# Patient Record
Sex: Male | Born: 1985 | Race: White | Hispanic: No | Marital: Single | Smoking: Former smoker
Health system: Southern US, Community
[De-identification: ages and names within clinical notes are randomized; demographics above are authoritative.]

## PROBLEM LIST (undated history)

## (undated) DIAGNOSIS — F419 Anxiety disorder, unspecified: Secondary | ICD-10-CM

## (undated) DIAGNOSIS — F909 Attention-deficit hyperactivity disorder, unspecified type: Secondary | ICD-10-CM

## (undated) DIAGNOSIS — G4730 Sleep apnea, unspecified: Secondary | ICD-10-CM

## (undated) DIAGNOSIS — F410 Panic disorder [episodic paroxysmal anxiety] without agoraphobia: Secondary | ICD-10-CM

## (undated) DIAGNOSIS — I10 Essential (primary) hypertension: Secondary | ICD-10-CM

## (undated) DIAGNOSIS — F329 Major depressive disorder, single episode, unspecified: Secondary | ICD-10-CM

## (undated) HISTORY — DX: Sleep apnea, unspecified: G47.30

## (undated) HISTORY — DX: Attention-deficit hyperactivity disorder, unspecified type: F90.9

---

## 2007-02-21 ENCOUNTER — Emergency Department: Payer: Self-pay | Admitting: Emergency Medicine

## 2007-08-25 ENCOUNTER — Emergency Department: Payer: Self-pay | Admitting: Unknown Physician Specialty

## 2007-11-17 ENCOUNTER — Emergency Department: Payer: Self-pay | Admitting: Emergency Medicine

## 2007-11-27 ENCOUNTER — Emergency Department: Payer: Self-pay | Admitting: Internal Medicine

## 2010-03-29 ENCOUNTER — Emergency Department: Payer: Self-pay | Admitting: Emergency Medicine

## 2010-10-02 ENCOUNTER — Emergency Department: Payer: Self-pay | Admitting: Emergency Medicine

## 2010-11-16 ENCOUNTER — Emergency Department: Payer: Self-pay | Admitting: Emergency Medicine

## 2011-01-16 ENCOUNTER — Emergency Department: Payer: Self-pay | Admitting: Emergency Medicine

## 2011-01-16 ENCOUNTER — Emergency Department: Payer: Self-pay | Admitting: Internal Medicine

## 2011-08-24 ENCOUNTER — Emergency Department: Payer: Self-pay | Admitting: Emergency Medicine

## 2011-08-24 LAB — COMPREHENSIVE METABOLIC PANEL
Albumin: 4.2 g/dL (ref 3.4–5.0)
Alkaline Phosphatase: 109 U/L (ref 50–136)
Anion Gap: 8 (ref 7–16)
Calcium, Total: 9.1 mg/dL (ref 8.5–10.1)
Chloride: 102 mmol/L (ref 98–107)
Co2: 29 mmol/L (ref 21–32)
Creatinine: 1.18 mg/dL (ref 0.60–1.30)
EGFR (African American): >60
EGFR (Non-African Amer.): >60
Glucose: 96 mg/dL (ref 65–99)
Potassium: 3.6 mmol/L (ref 3.5–5.1)
SGPT (ALT): 27 U/L
Sodium: 139 mmol/L (ref 136–145)

## 2011-08-24 LAB — URINALYSIS, COMPLETE
Blood: NEGATIVE
Leukocyte Esterase: NEGATIVE
Ph: 8 (ref 4.5–8.0)
Protein: NEGATIVE
RBC,UR: 3 /HPF (ref 0–5)
Specific Gravity: 1.015 (ref 1.003–1.030)
Squamous Epithelial: <1
WBC UR: 1 /HPF (ref 0–5)

## 2011-08-24 LAB — CBC
HGB: 19.5 g/dL — ABNORMAL HIGH (ref 13.0–18.0)
MCH: 30.6 pg (ref 26.0–34.0)
RBC: 6.36 10*6/uL — ABNORMAL HIGH (ref 4.40–5.90)

## 2011-08-24 LAB — DRUG SCREEN, URINE
Amphetamines, Ur Screen: NEGATIVE (ref ?–1000)
Barbiturates, Ur Screen: NEGATIVE (ref ?–200)
Benzodiazepine, Ur Scrn: NEGATIVE (ref ?–200)
MDMA (Ecstasy)Ur Screen: NEGATIVE (ref ?–500)
Phencyclidine (PCP) Ur S: NEGATIVE (ref ?–25)
Tricyclic, Ur Screen: NEGATIVE (ref ?–1000)

## 2011-08-24 LAB — TSH: Thyroid Stimulating Horm: 0.83 u[IU]/mL

## 2011-08-24 LAB — ETHANOL: Ethanol: <3 mg/dL

## 2011-11-26 ENCOUNTER — Emergency Department: Payer: Self-pay | Admitting: *Deleted

## 2012-02-25 LAB — CBC WITH DIFFERENTIAL/PLATELET
Basophil #: 0.1 10*3/uL (ref 0.0–0.1)
Eosinophil #: 0.1 10*3/uL (ref 0.0–0.7)
Eosinophil %: 1 %
HCT: 55.6 % — ABNORMAL HIGH (ref 40.0–52.0)
Lymphocyte #: 5.6 10*3/uL — ABNORMAL HIGH (ref 1.0–3.6)
Lymphocyte %: 38.4 %
MCH: 32.2 pg (ref 26.0–34.0)
MCHC: 34.9 g/dL (ref 32.0–36.0)
Monocyte %: 7.2 %
Neutrophil %: 52.8 %
RDW: 13 % (ref 11.5–14.5)
WBC: 14.7 10*3/uL — ABNORMAL HIGH (ref 3.8–10.6)

## 2012-02-25 LAB — COMPREHENSIVE METABOLIC PANEL
Albumin: 4.8 g/dL (ref 3.4–5.0)
Anion Gap: 10 (ref 7–16)
BUN: 5 mg/dL — ABNORMAL LOW (ref 7–18)
Calcium, Total: 9 mg/dL (ref 8.5–10.1)
Chloride: 103 mmol/L (ref 98–107)
Co2: 28 mmol/L (ref 21–32)
EGFR (African American): >60
EGFR (Non-African Amer.): >60
Osmolality: 279 (ref 275–301)
Potassium: 3.4 mmol/L — ABNORMAL LOW (ref 3.5–5.1)
SGOT(AST): 33 U/L (ref 15–37)
SGPT (ALT): 36 U/L (ref 12–78)
Total Protein: 8.7 g/dL — ABNORMAL HIGH (ref 6.4–8.2)

## 2012-02-25 LAB — PROTIME-INR
INR: 0.9
INR: 1
Prothrombin Time: 12.6 secs (ref 11.5–14.7)
Prothrombin Time: 13.8 secs (ref 11.5–14.7)

## 2012-02-26 LAB — CBC WITH DIFFERENTIAL/PLATELET
Basophil #: 0 x10 3/mm 3
Basophil %: 0.2 %
Eosinophil #: 0.1 x10 3/mm 3
Eosinophil %: 0.5 %
HCT: 45.4 %
HGB: 16.1 g/dL
Lymphocyte %: 13.3 %
Lymphs Abs: 2.1 x10 3/mm 3
MCH: 32.6 pg
MCHC: 35.5 g/dL
MCV: 92 fL
Monocyte #: 1.6 "x10 3/mm " — ABNORMAL HIGH
Monocyte %: 10.2 %
Neutrophil #: 11.9 x10 3/mm 3 — ABNORMAL HIGH
Neutrophil %: 75.8 %
Platelet: 212 x10 3/mm 3
RBC: 4.95 x10 6/mm 3
RDW: 13.1 %
WBC: 15.7 x10 3/mm 3 — ABNORMAL HIGH

## 2012-02-26 LAB — POTASSIUM: Potassium: 3.5 mmol/L

## 2012-02-27 ENCOUNTER — Inpatient Hospital Stay: Payer: Self-pay | Admitting: Surgery

## 2012-03-01 LAB — CBC WITH DIFFERENTIAL/PLATELET
Basophil #: 0 10*3/uL (ref 0.0–0.1)
Basophil %: 0.3 %
Eosinophil #: 0.1 10*3/uL (ref 0.0–0.7)
Eosinophil %: 1 %
HCT: 46.2 % (ref 40.0–52.0)
Lymphocyte #: 1.4 10*3/uL (ref 1.0–3.6)
MCH: 32.2 pg (ref 26.0–34.0)
MCV: 92 fL (ref 80–100)
Monocyte %: 9.1 %
Neutrophil #: 9.1 10*3/uL — ABNORMAL HIGH (ref 1.4–6.5)
Platelet: 240 10*3/uL (ref 150–440)
RBC: 5.01 10*6/uL (ref 4.40–5.90)
RDW: 13.2 % (ref 11.5–14.5)

## 2013-03-11 ENCOUNTER — Emergency Department: Payer: Self-pay | Admitting: Emergency Medicine

## 2014-08-27 NOTE — Discharge Summary (Signed)
PATIENT NAME:  Gilbert Alvarez, Gilbert Alvarez MR#:  [id number] DATE OF BIRTH:  Nov 16, 1985  DATE OF ADMISSION:  02/27/2012 DATE OF DISCHARGE:  03/01/2012  TRANSFER DIAGNOSES: 1. Right index finger copperhead venomous snakebite with skin necrosis and need for tertiary care hand surgery evaluation.  2. History of chronic alcoholism.   PROCEDURES: Surgical consultation, elevation of hand and arm, intravenous antibiotics.   HOSPITAL COURSE SUMMARY: The patient was admitted from the Emergency Room to the medical service following the administration of antivenom. Surgical services became involved on the 19th. The patient was transferred to the surgical service. Over the course of the next several days the swelling in his wrist and arm improved. Due to noncompliance concerns the patient was kept in the hospital several more days. Over the course of that, his right index finger developed significant bulla. These appeared to be hemorrhagic in nature. Skin went from being viable to being areas of nonviability. There was significant swelling of the digit. Pro time initially was normal. Initially his white count was elevated and then repeated found to be within normal limits. Furthermore, his hemoglobin and platelet count remained normal. After discussion with the patient in consultation with [facility] it was decided to transfer the patient into their care. Arrangements have been made.    TRANSFER MEDICATIONS:  1. Acetaminophen. 2. Tylox 5/325, 1 to 2 tabs p.o. q.4 hours as needed for pain. 3. Ativan 0.5 to 1 mg IV push q.4 hours p.r.n. agitation as per CIWA protocol.  4. Nicotine patch. 5. Zofran as needed. 6. Paxil 40 mg once a day. 7. Ancef 1 gram IV every six hours.      REASON FOR TRANSFER: Unavailability of dedicated hand surgery specialist in our community. Possible need for debridement and grafting and reconstructive surgery.   ____________________________ Redge GainerMark A. Egbert GaribaldiBird,  MD mab:cms D: 03/01/2012 15:44:31 ET T: 03/01/2012 15:56:24 ET JOB#: [id number]  cc: Loraine LericheMark A. Egbert GaribaldiBird, MD, <Dictator>  Raynald KempMARK A Michaeline Eckersley MD ELECTRONICALLY SIGNED 03/01/2012 16:58

## 2014-08-27 NOTE — H&P (Signed)
PATIENT NAME:  LAYLA, GRAMM MR#:  [id number] DATE OF BIRTH:  [birth year]  DATE OF ADMISSION:  02/25/2012  PRIMARY CARE PHYSICIAN: He has no physician.   CHIEF COMPLAINT: Snakebite.   HISTORY OF PRESENT ILLNESS: Mr. Glazier is a 29 year old Caucasian male who states that he has two copperhead snakes, raising them at home. The purpose is to milk their venom and he thinks that he can help breast cancer research. The patient is a chronic alcoholic.  He is intoxicated with alcohol and states that he was trying to teach one of his friends how to handle a snake.  This attempt resulted in being bit by one snake on the index finger of his right hand. The patient came to the Emergency Department with soft tissue swelling and tingling and numbness sensation of the hand. He was also tachycardic with heart rate reaching 130 beats per minute, but this subsequently went down to 100 per minute. He denies any shortness of breath. No chest pain. No dizziness. No abdominal pain. No vomiting, although later he had nausea and he had one episode of vomiting.     REVIEW OF SYSTEMS: CONSTITUTIONAL: Denies any fever. No chills. No fatigue. EYES: No blurring of vision. No double vision. ENT: No hearing impairment. No sore throat. No dysphagia. CARDIOVASCULAR: No chest pain. No shortness of breath. No syncope. RESPIRATORY: No shortness of breath. No cough. No sputum production. GASTROINTESTINAL: No abdominal pain. No nausea. No vomiting, except just now he had one episode of vomiting but earlier did not. GENITOURINARY: No dysuria. No frequency of urination. MUSCULOSKELETAL: No joint pain or swelling. No muscular pain or swelling other than the right hand site of the bite. INTEGUMENT: No skin rash. No ulcers, but he has soft tissue swelling of the right hand. NEUROLOGIC: No focal weakness. No seizure activity. No headache. PSYCH: He has anxiety but no depression. ENDOCRINE: No polyuria or polydipsia. No heat or cold intolerance.   PAST  MEDICAL HISTORY: Healthy apart from history of anxiety disorder.   PAST SURGICAL HISTORY: None.   SOCIAL HABITS: Chronic smoker, 1 pack per day since age of 89. He drinks beer daily, about five cans of beer.   FAMILY HISTORY: His father died from pancreatic cancer. His mother is alive and she is healthy. He does not know much of her health issues as he does not have much contact with her.   SOCIAL HISTORY: He has an eighth grade education.  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  Right now he is living with his grandmother.   ADMISSION MEDICATIONS:  Paroxetine or Paxil 40 mg once a day.   ALLERGIES: No known drug allergies. He reports side effects from Zoloft that make him feel tired or lethargic- this is not a true allergy.   PHYSICAL EXAMINATION:  VITAL SIGNS: Blood pressure 120/84, respiratory rate 20, pulse earlier was 132 and now it is 100. Temperature 98.8, oxygen saturation 95%.   GENERAL APPEARANCE: Young male lying in bed in no acute distress.   HEAD: Unremarkable. No pallor. No icterus. No cyanosis.   EARS, NOSE, THROAT: Hearing was normal. Nasal mucosa, lips, tongue were normal.   EYES: Normal eyelids and conjunctivae. Pupils about 5 mm, equal, and reactive to light.   NECK: Supple. Trachea at midline. No thyromegaly. No cervical lymphadenopathy. No masses.   HEART: Normal S1, S2. No S3, S4. No murmur. No gallop. No carotid bruits.   LUNGS: Normal breathing pattern without use  of accessory muscles. No rales. No wheezing.   ABDOMEN: Soft without tenderness. No hepatosplenomegaly. No masses. No hernias.   SKIN: Right hand soft tissue swelling. There is a puncture wound on the lateral side of the index finger. The rest of the skin examination is unremarkable. No rash. No subcutaneous nodules.   MUSCULOSKELETAL: No joint swelling. No clubbing.   NEUROLOGIC: Cranial nerves II through XII are intact. No focal motor deficit.   PSYCHIATRIC: The patient is  alert, oriented to place and people. Mood and affect were normal.   LABORATORY, DIAGNOSTIC, AND RADIOLOGICAL DATA: Serum glucose 99, BUN 5, creatinine 0.7, sodium 141, potassium 3.4. Alcohol level was 204. Liver function tests were normal. Bilirubin was 1.4. CBC showed white count of 14,000, hemoglobin 19, hematocrit 55, platelet count 264. Prothrombin time 12.6 with INR of 0.9.   ASSESSMENT:  1. Copperhead snakebite on the right hand with resulting soft tissue swelling.  2. Chronic alcoholism.  3. Mild hypokalemia.  4. Leukocytosis.  5. Anxiety disorder.   PLAN: The patient will be admitted to the hospital for observation. We will monitor the right hand soft tissue swelling. The patient right now is receiving antivenom, IV hydration, pain control, Zofran p.r.n. for nausea and vomiting. Potassium replacement for the mild hypokalemia. The patient will receive one dose of 20 mEq. I will repeat his prothrombin time and APTT in the morning and determine at that point if he needs further treatment with antivenom or not. We will place him on a nicotine patch since he is a smoker. The patient needs a reassuring attitude since he is anxious and asking if he is going to die. During the conversation he became tachycardic just simply because we are discussing his illness. Ativan p.r.n. whenever needed.   TIME SPENT: Time spent evaluating this patient took more than 55 minutes.    ____________________________ Carney CornersAmir M. Rudene Rearwish, MD amd:bjt D: 02/25/2012 05:45:53 ET T: 02/25/2012 09:57:45 ET JOB#: [id number]  cc: Carney CornersAmir M. Rudene Rearwish, MD, <Dictator> Zollie ScaleAMIR M Lorenza Winkleman MD ELECTRONICALLY SIGNED 02/25/2012 22:35

## 2014-08-27 NOTE — Consult Note (Signed)
Brief Consult Note: Diagnosis: R index finger copperhead bite.   Patient was seen by consultant.   Recommend further assessment or treatment.   Orders entered.   Discussed with Attending MD.   Comments: Full grown, regularly milked snake - therefore he received a lot of venom.  Normal sensation, severe swelling of hand and wrist and forearm with small black blister and fang marks on index finger. He may lose a little skin, but I doubt he loses a phalanx or the digit. He needs significant elevation into the 'Heil Hitler' position as a baseball throwing position may compress his SCV. I suggest continued hospitalization as he will likely be non-compliant with this at home. Ultimately he may need OT. I will take him on my service if you wish.  Electronic Signatures: Claude MangesMarterre, Santosha Jividen F (MD)  (Signed 19-Oct-13 12:12)  Authored: Brief Consult Note   Last Updated: 19-Oct-13 12:12 by Claude MangesMarterre, Keyton Bhat F (MD)

## 2014-09-10 ENCOUNTER — Encounter: Payer: Self-pay | Admitting: *Deleted

## 2014-09-10 ENCOUNTER — Emergency Department
Admission: EM | Admit: 2014-09-10 | Discharge: 2014-09-10 | Disposition: A | Discharge status: Home / Self Care | Payer: Self-pay | Attending: Emergency Medicine | Admitting: Emergency Medicine

## 2014-09-10 DIAGNOSIS — S61255A Open bite of left ring finger without damage to nail, initial encounter: Secondary | ICD-10-CM | POA: Insufficient documentation

## 2014-09-10 DIAGNOSIS — Y9389 Activity, other specified: Secondary | ICD-10-CM | POA: Insufficient documentation

## 2014-09-10 DIAGNOSIS — Y998 Other external cause status: Secondary | ICD-10-CM | POA: Insufficient documentation

## 2014-09-10 DIAGNOSIS — W5301XA Bitten by mouse, initial encounter: Secondary | ICD-10-CM | POA: Insufficient documentation

## 2014-09-10 DIAGNOSIS — Y9289 Other specified places as the place of occurrence of the external cause: Secondary | ICD-10-CM | POA: Insufficient documentation

## 2014-09-10 DIAGNOSIS — Z87891 Personal history of nicotine dependence: Secondary | ICD-10-CM | POA: Insufficient documentation

## 2014-09-10 HISTORY — DX: Major depressive disorder, single episode, unspecified: F32.9

## 2014-09-10 HISTORY — DX: Anxiety disorder, unspecified: F41.9

## 2014-09-10 MED ORDER — AMOXICILLIN-POT CLAVULANATE 875-125 MG PO TABS: 1.0000 | ORAL_TABLET | Freq: Two times a day (BID) | ORAL | Status: AC

## 2014-09-10 NOTE — Discharge Instructions (Signed)

## 2014-09-10 NOTE — ED Notes (Signed)
Pt states he was helping his grandfather clean out a worm bed when he was bit on hos left second finger by a mouse. Pt wants to make sure everything is ok.

## 2014-09-10 NOTE — ED Provider Notes (Signed)
[facility] Emergency Department Provider Note    ____________________________________________  Time seen: 2240  I have reviewed the triage vital signs and the nursing notes.   HISTORY  Chief Complaint Animal Bite       HPI Gilbert Alvarez is a 29 y.o. male who was bitten by a mouse on his left ring finger since tetanus is up-to-date concerned about possible rabies wants the evaluation of the wound denies any pain there is no redness for range of motion and no other complaints at this time     Past Medical History  Diagnosis Date  . Depression   . Anxiety     There are no active problems to display for this patient.   No past surgical history on file.  Current Outpatient Rx  Name  Route  Sig  Dispense  Refill  . amoxicillin-clavulanate (AUGMENTIN) 875-125 MG per tablet   Oral   Take 1 tablet by mouth 2 (two) times daily.   14 tablet   0     Allergies Tramadol  No family history on file.  Social History History  Substance Use Topics  . Smoking status: Former Games developer  . Smokeless tobacco: Current User  . Alcohol Use: No    Review of Systems  Constitutional: Negative for fever. Eyes: Negative for visual changes. ENT: Negative for sore throat. Cardiovascular: Negative for chest pain. Musculoskeletal: Negative for back pain. Skin: Negative for rash. Neurological: Negative for headaches, focal weakness or numbness.   10-point ROS otherwise negative.  ____________________________________________   PHYSICAL EXAM:  VITAL SIGNS: ED Triage Vitals  Enc Vitals Group     BP 09/10/14 2229 129/87 mmHg     Pulse Rate 09/10/14 2229 103     Resp 09/10/14 2229 17     Temp 09/10/14 2229 98.7 F (37.1 C)     Temp src --      SpO2 09/10/14 2229 98 %     Weight 09/10/14 2229 160 lb (72.576 kg)     Height 09/10/14 2229  (1.676 m)     Head Cir --      Peak Flow --      Pain Score --      Pain Loc --      Pain Edu? --    Excl. in GC? --      Constitutional: Alert and oriented. Well appearing and in no distress.   Cardiovascular: Normal rate, regular rhythm. Normal and symmetric distal pulses are present in all extremities. No murmurs, rubs, or gallops. Respiratory: Normal respiratory effort without tachypnea nor retractions. Breath sounds are clear and equal bilaterally. No wheezes/rales/rhonchi.  Musculoskeletal: Nontender with normal range of motion in all extremities. No joint effusions.  No lower extremity tenderness nor edema. Neurologic:  Normal speech and language. No gross focal neurologic deficits are appreciated. Speech is normal. No gait instability. Skin:  Skin is warm, dry and intact. No rash noted. Psychiatric: Mood and affect are normal. Speech and behavior are normal. Patient exhibits appropriate insight and judgment.  ____________________________________________      PROCEDURES  Procedure(s) performed: None  Critical Care performed: No  ____________________________________________   INITIAL IMPRESSION / ASSESSMENT AND PLAN / ED COURSE  Pertinent labs & imaging results that were available during my care of the patient were reviewed by me and considered in my medical decision making (see chart for details).  Now spite uncomplicated doesn't appear to have broken the skin and right the patient prescription for oral  antibiotics just in case advised against rabies series since it was a mouse bite will have patient follow-up in the next 2-3 days  ____________________________________________   FINAL CLINICAL IMPRESSION(S) / ED DIAGNOSES  Final diagnoses:  Bitten by mouse, initial encounter    Misao Fackrell Rosalyn GessWilliam C Caleigha Zale, PA-C 09/10/14 2255  Loleta Roseory Forbach, MD 09/10/14 2330

## 2015-09-07 ENCOUNTER — Emergency Department: Payer: Self-pay

## 2015-09-07 ENCOUNTER — Emergency Department
Admission: EM | Admit: 2015-09-07 | Discharge: 2015-09-07 | Disposition: A | Discharge status: Home / Self Care | Payer: Self-pay | Attending: Student | Admitting: Student

## 2015-09-07 ENCOUNTER — Encounter: Payer: Self-pay | Admitting: Emergency Medicine

## 2015-09-07 DIAGNOSIS — F329 Major depressive disorder, single episode, unspecified: Secondary | ICD-10-CM | POA: Insufficient documentation

## 2015-09-07 DIAGNOSIS — R079 Chest pain, unspecified: Secondary | ICD-10-CM | POA: Insufficient documentation

## 2015-09-07 DIAGNOSIS — F1010 Alcohol abuse, uncomplicated: Secondary | ICD-10-CM | POA: Insufficient documentation

## 2015-09-07 DIAGNOSIS — F17210 Nicotine dependence, cigarettes, uncomplicated: Secondary | ICD-10-CM | POA: Insufficient documentation

## 2015-09-07 DIAGNOSIS — F410 Panic disorder [episodic paroxysmal anxiety] without agoraphobia: Secondary | ICD-10-CM

## 2015-09-07 LAB — BLOOD GAS, VENOUS
Acid-base deficit: 0.9 mmol/L (ref 0.0–2.0)
Bicarbonate: 24.3 mEq/L (ref 21.0–28.0)
O2 SAT: 64 %
PATIENT TEMPERATURE: 37
PCO2 VEN: 41 mmHg — AB (ref 44.0–60.0)
pH, Ven: 7.38 (ref 7.320–7.430)
pO2, Ven: 34 mmHg (ref 31.0–45.0)

## 2015-09-07 LAB — CBC WITH DIFFERENTIAL/PLATELET
Basophils Absolute: 0 10*3/uL (ref 0–0.1)
Basophils Relative: 0 %
Eosinophils Absolute: 0.1 10*3/uL (ref 0–0.7)
Eosinophils Relative: 1 %
HCT: 57.7 % — ABNORMAL HIGH (ref 40.0–52.0)
Hemoglobin: 19.9 g/dL — ABNORMAL HIGH (ref 13.0–18.0)
Lymphocytes Relative: 18 %
Lymphs Abs: 1.8 10*3/uL (ref 1.0–3.6)
MCH: 32.9 pg (ref 26.0–34.0)
MCHC: 34.5 g/dL (ref 32.0–36.0)
MCV: 95.5 fL (ref 80.0–100.0)
Monocytes Absolute: 1.1 10*3/uL — ABNORMAL HIGH (ref 0.2–1.0)
Monocytes Relative: 11 %
Neutro Abs: 7.2 10*3/uL — ABNORMAL HIGH (ref 1.4–6.5)
Neutrophils Relative %: 70 %
Platelets: 234 10*3/uL (ref 150–440)
RBC: 6.04 MIL/uL — ABNORMAL HIGH (ref 4.40–5.90)
RDW: 14.5 % (ref 11.5–14.5)
WBC: 10.2 10*3/uL (ref 3.8–10.6)

## 2015-09-07 LAB — COMPREHENSIVE METABOLIC PANEL
ALK PHOS: 75 U/L (ref 38–126)
ALT: 34 U/L (ref 17–63)
ALT: 23 U/L (ref 17–63)
ANION GAP: 4 — AB (ref 5–15)
AST: 40 U/L (ref 15–41)
AST: 22 U/L (ref 15–41)
Albumin: 4.5 g/dL (ref 3.5–5.0)
Albumin: 3 g/dL — ABNORMAL LOW (ref 3.5–5.0)
Alkaline Phosphatase: 111 U/L (ref 38–126)
Anion gap: 22 — ABNORMAL HIGH (ref 5–15)
BILIRUBIN TOTAL: 2.9 mg/dL — AB (ref 0.3–1.2)
BUN: 6 mg/dL (ref 6–20)
BUN: <5 mg/dL — ABNORMAL LOW (ref 6–20)
CALCIUM: 7.4 mg/dL — AB (ref 8.9–10.3)
CO2: 18 mmol/L — ABNORMAL LOW (ref 22–32)
CO2: 25 mmol/L (ref 22–32)
Calcium: 9.5 mg/dL (ref 8.9–10.3)
Chloride: 98 mmol/L — ABNORMAL LOW (ref 101–111)
Chloride: 110 mmol/L (ref 101–111)
Creatinine, Ser: 0.9 mg/dL (ref 0.61–1.24)
Creatinine, Ser: 0.81 mg/dL (ref 0.61–1.24)
GFR calc Af Amer: >60 mL/min (ref >60)
GFR calc non Af Amer: >60 mL/min (ref >60)
GFR calc non Af Amer: >60 mL/min (ref >60)
Glucose, Bld: 107 mg/dL — ABNORMAL HIGH (ref 65–99)
Glucose, Bld: 397 mg/dL — ABNORMAL HIGH (ref 65–99)
Potassium: 3.3 mmol/L — ABNORMAL LOW (ref 3.5–5.1)
Potassium: 3.5 mmol/L (ref 3.5–5.1)
Sodium: 138 mmol/L (ref 135–145)
Sodium: 139 mmol/L (ref 135–145)
TOTAL PROTEIN: 5.4 g/dL — AB (ref 6.5–8.1)
Total Bilirubin: 3.9 mg/dL — ABNORMAL HIGH (ref 0.3–1.2)
Total Protein: 7.9 g/dL (ref 6.5–8.1)

## 2015-09-07 LAB — SALICYLATE LEVEL: Salicylate Lvl: <4 mg/dL (ref 2.8–30.0)

## 2015-09-07 LAB — MAGNESIUM: MAGNESIUM: 2 mg/dL (ref 1.7–2.4)

## 2015-09-07 LAB — URINE DRUG SCREEN, QUALITATIVE (ARMC ONLY)
Amphetamines, Ur Screen: NOT DETECTED
BARBITURATES, UR SCREEN: NOT DETECTED
BENZODIAZEPINE, UR SCRN: NOT DETECTED
CANNABINOID 50 NG, UR ~~LOC~~: NOT DETECTED
COCAINE METABOLITE, UR ~~LOC~~: NOT DETECTED
MDMA (Ecstasy)Ur Screen: NOT DETECTED
Methadone Scn, Ur: NOT DETECTED
OPIATE, UR SCREEN: NOT DETECTED
Phencyclidine (PCP) Ur S: NOT DETECTED
TRICYCLIC, UR SCREEN: NOT DETECTED

## 2015-09-07 LAB — BILIRUBIN, DIRECT: Bilirubin, Direct: 0.2 mg/dL (ref 0.1–0.5)

## 2015-09-07 LAB — ETHANOL: Alcohol, Ethyl (B): <5 mg/dL (ref <5)

## 2015-09-07 LAB — ACETAMINOPHEN LEVEL

## 2015-09-07 LAB — OSMOLALITY: Osmolality: 290 mOsm/kg (ref 275–295)

## 2015-09-07 LAB — TROPONIN I: Troponin I: <0.03 ng/mL (ref <0.031)

## 2015-09-07 MED ORDER — SODIUM CHLORIDE 0.9 % IV BOLUS (SEPSIS)
1000.0000 mL | Freq: Once | INTRAVENOUS | Status: AC
Start: 2015-09-07 — End: 2015-09-07
  Administered 2015-09-07: 1000 mL via INTRAVENOUS

## 2015-09-07 MED ORDER — LORAZEPAM 2 MG/ML IJ SOLN
1.0000 mg | Freq: Once | INTRAMUSCULAR | Status: AC
  Administered 2015-09-07: 1 mg via INTRAVENOUS
  Filled 2015-09-07: qty 1

## 2015-09-07 MED ORDER — SODIUM CHLORIDE 0.9 % IV BOLUS (SEPSIS)
1000.0000 mL | Freq: Once | INTRAVENOUS | Status: AC
  Administered 2015-09-07: 1000 mL via INTRAVENOUS

## 2015-09-07 MED ORDER — DEXTROSE 5 % AND 0.9 % NACL IV BOLUS
1000.0000 mL | Freq: Once | INTRAVENOUS | Status: AC
  Administered 2015-09-07: 1000 mL via INTRAVENOUS
  Filled 2015-09-07: qty 1000

## 2015-09-07 NOTE — ED Notes (Signed)
Patient A+O x4. Patient has no complaints at this time. Patient states that his anxiety has resolved and wishes to hold ativan at this time.

## 2015-09-07 NOTE — ED Provider Notes (Signed)
[facility] Emergency Department Provider Note   ____________________________________________  Time seen: Approximately 11:52 AM  I have reviewed the triage vital signs and the nursing notes.   HISTORY  Chief Complaint Panic Attack    HPI Gilbert Alvarez is a 30 y.o. male with history of anxiety, depression, heavy daily alcohol abuse who presents for evaluation of a panic attack which began just prior to arrival, gradual onset, constant since onset, severe. Patient reports that while he was driving today he developed some shortness of breath, sensation that his whole body was getting tense and drawing up, he started having spasms in his hands, tightness in his chest. He reports this is consistent with his usual panic attacks. He has had 2-3 panic attacks over the past few days which he believes is triggered by upper respiratory tract infection symptoms. He reports that 3 days ago he developed some cough and runny nose. When he coughed, he developed some pain in his chest and he reports he got quite anxious because he was worried that "something was going on with my lungs". Denies fever, no vomiting, diarrhea, fevers or chills. He denies any history of coronary artery disease, denies any personal or family history of PE or DVT, hemoptysis, no recent surgeries, no estrogen use. He has been noncompliant with his Paxil. He denies any suicidal ideation, homicidal ideation or audiovisual hallucinations.   Past Medical History  Diagnosis Date  . Depression   . Anxiety     There are no active problems to display for this patient.   History reviewed. No pertinent past surgical history.  No current outpatient prescriptions on file.  Allergies Tramadol  History reviewed. No pertinent family history.  Social History Social History  Substance Use Topics  . Smoking status: Current Every Day Smoker -- 1.00 packs/day    Types: Cigarettes  . Smokeless tobacco: Current  User  . Alcohol Use: Yes     Comment: occassionaly    Review of Systems Constitutional: No fever/chills Eyes: No visual changes. ENT: No sore throat. Cardiovascular: +chest pain. Respiratory: Denies shortness of breath. Gastrointestinal: No abdominal pain.  No nausea, no vomiting.  No diarrhea.  No constipation. Genitourinary: Negative for dysuria. Musculoskeletal: Negative for back pain. Skin: Negative for rash. Neurological: Negative for headaches, focal weakness or numbness.  10-point ROS otherwise negative.  ____________________________________________   PHYSICAL EXAM:  VITAL SIGNS: ED Triage Vitals  Enc Vitals Group     BP 09/07/15 1146 139/96 mmHg     Pulse Rate 09/07/15 1146 89     Resp 09/07/15 1146 22     Temp 09/07/15 1146 97.8 F (36.6 C)     Temp Source 09/07/15 1146 Oral     SpO2 09/07/15 1146 100 %     Weight 09/07/15 1146 160 lb (72.576 kg)     Height 09/07/15 1146  (1.676 m)     Head Cir --      Peak Flow --      Pain Score --      Pain Loc --      Pain Edu? --      Excl. in GC? --     Constitutional: Alert and oriented. Severely anxious-appearing, hyperventilating, obvious carpopedal spasms. Eyes: Conjunctivae are normal. PERRL. EOMI. Head: Atraumatic. Nose: No congestion/rhinnorhea. Mouth/Throat: Mucous membranes are moist.  Oropharynx non-erythematous. Neck: No stridor.  Supple without meningismus. Cardiovascular: Normal rate, regular rhythm. Grossly normal heart sounds.  Good peripheral circulation. Respiratory: Normal respiratory  effort, mild tachypnea. No retractions. Lungs CTAB. Gastrointestinal: Soft and nontender. No distention.  No CVA tenderness. Genitourinary: deferred Musculoskeletal: No lower extremity tenderness nor edema.  No joint effusions. No calf swelling/asymmetry/tenderness. Neurologic:  Normal speech and language. No gross focal neurologic deficits are appreciated.  Skin:  Skin is warm, dry and intact. No rash  noted. Psychiatric: Mood is very anxious, affect is normal. Speech and behavior are normal.  ____________________________________________   LABS (all labs ordered are listed, but only abnormal results are displayed)  Labs Reviewed  CBC WITH DIFFERENTIAL/PLATELET - Abnormal; Notable for the following:    RBC 6.04 (*)    Hemoglobin 19.9 (*)    HCT 57.7 (*)    Neutro Abs 7.2 (*)    Monocytes Absolute 1.1 (*)    All other components within normal limits  COMPREHENSIVE METABOLIC PANEL - Abnormal; Notable for the following:    Potassium 3.3 (*)    Chloride 98 (*)    CO2 18 (*)    Glucose, Bld 107 (*)    Total Bilirubin 3.9 (*)    Anion gap 22 (*)    All other components within normal limits  BLOOD GAS, VENOUS - Abnormal; Notable for the following:    pCO2, Ven 41 (*)    All other components within normal limits  ACETAMINOPHEN LEVEL - Abnormal; Notable for the following:    Acetaminophen (Tylenol), Serum <10 (*)    All other components within normal limits  COMPREHENSIVE METABOLIC PANEL - Abnormal; Notable for the following:    Glucose, Bld 397 (*)    BUN <5 (*)    Calcium 7.4 (*)    Total Protein 5.4 (*)    Albumin 3.0 (*)    Total Bilirubin 2.9 (*)    Anion gap 4 (*)    All other components within normal limits  MAGNESIUM  TROPONIN I  BILIRUBIN, DIRECT  ETHANOL  URINE DRUG SCREEN, QUALITATIVE ([facility] ONLY)  SALICYLATE LEVEL  OSMOLALITY   ____________________________________________  EKG  ED ECG REPORT I, Gayla DossGayle, Emilygrace Grothe A, the attending physician, personally viewed and interpreted this ECG.   Date: 09/07/2015  EKG Time: 11:46  Rate: 87  Rhythm: normal sinus rhythm  Axis: right  Intervals:none  ST&T Change: No acute ST elevation.  ____________________________________________  RADIOLOGY  CXR IMPRESSION: No radiographic evidence of acute cardiopulmonary disease.   US abdomen IMPRESSION: 1. No acute abnormalities in the abdomen. Specifically, no evidence of  biliary tract obstruction. ____________________________________________   PROCEDURES  Procedure(s) performed: None  Critical Care performed: No  ____________________________________________   INITIAL IMPRESSION / ASSESSMENT AND PLAN / ED COURSE  Pertinent labs & imaging results that were available during my care of the patient were reviewed by me and considered in my medical decision making (see chart for details).  Arta BruceSeth J Haris is a 30 y.o. male with history of anxiety, depression, history of alcohol abuse who presents for evaluation of a panic attack which began just prior to arrival. On exam, he does appear to be having a panic attack, severely anxious, mild tachypnea secondary to hyperventilation, he is having carpopedal spasms. EKG reassuring, not consistent with acute ischemia. Given his complaints of chest tightness, cough, we'll obtain screening labs, chest x-ray. We'll treat him symptomatically with IV fluids, Ativan and reassess for disposition.  ----------------------------------------- 5:42 PM on 09/07/2015 ----------------------------------------- The patient's initial CMP was reviewed and was concerning for acidosis with a CO2 of 18, wide anion gap at 22, T bili elevated at 3.9 however  this is an indirect hyperbilirubinemia and his ultrasound is unremarkable. I suspect this is secondary to mild liver dysfunction and given his known heavy alcohol use. He denies any toxic alcohol ingestion reporting that he only drinks beer, no moonshine, no ethmoid glycol/antifreeze.Calculated, serum osmolality is 285, serum osmole gap is only 5 and I doubt that this represents a toxic alcohol ingestion as the cause of his elevated anion gap, additionally he is not acidotic on his blood gas, pH is 7.38. He received liberal IV fluids as well as dextrose containing fluids and his anion gap closed completely on his repeat CMP. I suspect that his acidosis was secondary to alcoholic ketoacidosis. His  troponin was negative, the remainder of his ingestion panel is unremarkable. Currently he reports he feels well and he appears much more comfortable after Ativan, resting comfortably in bed. His vital signs have normalized at the time of discharge. He has been counseled on alcohol cessation and will follow up with his primary care doctor tomorrow for reevaluation. He is suitable for discharge. His glucose at the time of discharge is 397 but that is because I gave him dextrose containing fluids here to try and treat his alcoholic ketoacidosis. We'll DC home with return precautions. He is comfortable with the discharge plan. ____________________________________________   FINAL CLINICAL IMPRESSION(S) / ED DIAGNOSES  Final diagnoses:  Panic attack  Alcohol abuse      NEW MEDICATIONS STARTED DURING THIS VISIT:  There are no discharge medications for this patient.    Note:  This document was prepared using Dragon voice recognition software and may include unintentional dictation errors.    Gayla Doss, MD 09/07/15 [phone]

## 2015-09-07 NOTE — ED Notes (Signed)
Pt to ED via EMS from home c/o panic attack today.  Pt states had panic attack 3-4 days ago and started back on paxil.  Pt had panic attack today while driving and had SOB but denies now.  Pt also c/o "chest cold" and cough for 3-4 days.  Pt arrives A&Ox4, speaking in complete and coherent sentences and in NAD at this time.

## 2015-09-07 NOTE — ED Notes (Signed)
Patient transported to Ultrasound 

## 2015-09-25 ENCOUNTER — Encounter: Payer: Self-pay | Admitting: Emergency Medicine

## 2015-09-25 ENCOUNTER — Emergency Department
Admission: EM | Admit: 2015-09-25 | Discharge: 2015-09-25 | Disposition: A | Discharge status: Home / Self Care | Payer: Self-pay | Attending: Emergency Medicine | Admitting: Emergency Medicine

## 2015-09-25 DIAGNOSIS — F1010 Alcohol abuse, uncomplicated: Secondary | ICD-10-CM | POA: Insufficient documentation

## 2015-09-25 DIAGNOSIS — F419 Anxiety disorder, unspecified: Secondary | ICD-10-CM | POA: Insufficient documentation

## 2015-09-25 DIAGNOSIS — F329 Major depressive disorder, single episode, unspecified: Secondary | ICD-10-CM | POA: Insufficient documentation

## 2015-09-25 DIAGNOSIS — F17210 Nicotine dependence, cigarettes, uncomplicated: Secondary | ICD-10-CM | POA: Insufficient documentation

## 2015-09-25 LAB — COMPREHENSIVE METABOLIC PANEL
ALBUMIN: 4.8 g/dL (ref 3.5–5.0)
ALT: 33 U/L (ref 17–63)
ANION GAP: 14 (ref 5–15)
AST: 40 U/L (ref 15–41)
Alkaline Phosphatase: 111 U/L (ref 38–126)
BUN: 6 mg/dL (ref 6–20)
CO2: 24 mmol/L (ref 22–32)
Calcium: 9.8 mg/dL (ref 8.9–10.3)
Chloride: 97 mmol/L — ABNORMAL LOW (ref 101–111)
Creatinine, Ser: 0.97 mg/dL (ref 0.61–1.24)
GFR calc Af Amer: >60 mL/min (ref >60)
GFR calc non Af Amer: >60 mL/min (ref >60)
GLUCOSE: 122 mg/dL — AB (ref 65–99)
POTASSIUM: 4.2 mmol/L (ref 3.5–5.1)
SODIUM: 135 mmol/L (ref 135–145)
Total Bilirubin: 4.2 mg/dL — ABNORMAL HIGH (ref 0.3–1.2)
Total Protein: 8.4 g/dL — ABNORMAL HIGH (ref 6.5–8.1)

## 2015-09-25 LAB — CBC WITH DIFFERENTIAL/PLATELET
Basophils Absolute: 0.1 10*3/uL (ref 0–0.1)
Basophils Relative: 1 %
Eosinophils Absolute: 0.1 10*3/uL (ref 0–0.7)
Eosinophils Relative: 1 %
HEMATOCRIT: 59.9 % — AB (ref 40.0–52.0)
Hemoglobin: 20.2 g/dL — ABNORMAL HIGH (ref 13.0–18.0)
Lymphs Abs: 3.3 10*3/uL (ref 1.0–3.6)
MCH: 32.5 pg (ref 26.0–34.0)
MCHC: 33.7 g/dL (ref 32.0–36.0)
MCV: 96.5 fL (ref 80.0–100.0)
MONO ABS: 0.9 10*3/uL (ref 0.2–1.0)
NEUTROS ABS: 7.5 10*3/uL — AB (ref 1.4–6.5)
Neutrophils Relative %: 62 %
Platelets: 263 10*3/uL (ref 150–440)
RBC: 6.21 MIL/uL — ABNORMAL HIGH (ref 4.40–5.90)
RDW: 14.2 % (ref 11.5–14.5)
WBC: 11.9 10*3/uL — ABNORMAL HIGH (ref 3.8–10.6)

## 2015-09-25 LAB — URINE DRUG SCREEN, QUALITATIVE (ARMC ONLY)
Amphetamines, Ur Screen: NOT DETECTED
Barbiturates, Ur Screen: NOT DETECTED
Benzodiazepine, Ur Scrn: NOT DETECTED
CANNABINOID 50 NG, UR ~~LOC~~: NOT DETECTED
COCAINE METABOLITE, UR ~~LOC~~: NOT DETECTED
MDMA (ECSTASY) UR SCREEN: NOT DETECTED
Methadone Scn, Ur: NOT DETECTED
Opiate, Ur Screen: NOT DETECTED
Phencyclidine (PCP) Ur S: NOT DETECTED
TRICYCLIC, UR SCREEN: NOT DETECTED

## 2015-09-25 LAB — LIPASE, BLOOD: Lipase: 51 U/L (ref 11–51)

## 2015-09-25 LAB — ETHANOL: Alcohol, Ethyl (B): <5 mg/dL (ref <5)

## 2015-09-25 MED ORDER — SODIUM CHLORIDE 0.9 % IV BOLUS (SEPSIS)
1000.0000 mL | Freq: Once | INTRAVENOUS | Status: AC
  Administered 2015-09-25: 1000 mL via INTRAVENOUS

## 2015-09-25 MED ORDER — VITAMIN B-1 100 MG PO TABS
100.0000 mg | ORAL_TABLET | Freq: Once | ORAL | Status: AC
  Administered 2015-09-25: 100 mg via ORAL
  Filled 2015-09-25: qty 1

## 2015-09-25 MED ORDER — FOLIC ACID 1 MG PO TABS
1.0000 mg | ORAL_TABLET | Freq: Once | ORAL | Status: AC
  Administered 2015-09-25: 1 mg via ORAL
  Filled 2015-09-25: qty 1

## 2015-09-25 MED ORDER — LORAZEPAM 2 MG/ML IJ SOLN
2.0000 mg | Freq: Once | INTRAMUSCULAR | Status: AC
  Administered 2015-09-25: 2 mg via INTRAVENOUS
  Filled 2015-09-25: qty 1

## 2015-09-25 NOTE — ED Notes (Signed)
Patient is much more relaxed at this time. Even, non-labored respirations noted. Patient is texting on his cell phone.

## 2015-09-25 NOTE — ED Provider Notes (Addendum)
[facility] Emergency Department Provider Note  ____________________________________________   I have reviewed the triage vital signs and the nursing notes.   HISTORY  Chief Complaint Panic Attack    HPI Gilbert Alvarez is a 30 y.o. male with a history of panic attacks alcohol abuse, medical noncompliance, anxiety and depression with no SI or HI, states that he had a panic attack today. He states is exactly like multiple panic attacks with a sensation of panic and tingling in his fingers. He states he is panicking. He feels that he did not drink enough water today feels dehydrated. He denies any nausea or vomiting diarrhea fever or chills. He denies any history of seizures or alcohol withdrawal in the past. He states he is still a daily drinker. Last drink last night. He would like some" for his nerves" IV fluid for his perceived dehydration because it is hot outside.   Past Medical History  Diagnosis Date  . Depression   . Anxiety     There are no active problems to display for this patient.   No past surgical history on file.  No current outpatient prescriptions on file.  Allergies Tramadol  No family history on file.  Social History Social History  Substance Use Topics  . Smoking status: Current Every Day Smoker -- 1.00 packs/day    Types: Cigarettes  . Smokeless tobacco: Current User  . Alcohol Use: Yes     Comment: everynight    Review of Systems Constitutional: No fever/chills Eyes: No visual changes. ENT: No sore throat. No stiff neck no neck pain Cardiovascular: Denies chest pain. Respiratory: Denies shortness of breath. Gastrointestinal:   no vomiting.  No diarrhea.  No constipation. Genitourinary: Negative for dysuria. Musculoskeletal: Negative lower extremity swelling Skin: Negative for rash. Neurological: Negative for headaches, focal weakness or numbness. 10-point ROS otherwise  negative.  ____________________________________________   PHYSICAL EXAM:  VITAL SIGNS: ED Triage Vitals  Enc Vitals Group     BP 09/25/15 1632 174/117 mmHg     Pulse Rate 09/25/15 1632 140     Resp 09/25/15 1632 35     Temp --      Temp src --      SpO2 09/25/15 1632 100 %     Weight 09/25/15 1632 160 lb (72.576 kg)     Height 09/25/15 1632 5\' 6"  (1.676 m)     Head Cir --      Peak Flow --      Pain Score 09/25/15 1633 0     Pain Loc --      Pain Edu? --      Excl. in GC? --     Constitutional: Alert and oriented. Well appearing and in no acute distress. Eyes: Conjunctivae are normal. PERRL. EOMI. Head: Atraumatic. Nose: No congestion/rhinnorhea. Mouth/Throat: Mucous membranes are moist.  Oropharynx non-erythematous. Neck: No stridor.   Nontender with no meningismus Cardiovascular: Mild tachycardia noted regular rhythm. Grossly normal heart sounds.  Good peripheral circulation. Respiratory: Normal respiratory effort.  No retractions. Lungs CTAB. Abdominal: Soft and nontender. No distention. No guarding no rebound Back:  There is no focal tenderness or step off there is no midline tenderness there are no lesions noted. there is no CVA tenderness  Musculoskeletal: No lower extremity tenderness. No joint effusions, no DVT signs strong distal pulses no edema Neurologic:  Normal speech and language. No gross focal neurologic deficits are appreciated.  Skin:  Skin is warm, dry and intact. No rash noted.  Psychiatric: Mood and affect are very anxious and upset. Speech and behavior are normal.  ____________________________________________   LABS (all labs ordered are listed, but only abnormal results are displayed)  Labs Reviewed  COMPREHENSIVE METABOLIC PANEL  CBC WITH DIFFERENTIAL/PLATELET  LIPASE, BLOOD   ____________________________________________  EKG  I personally interpreted any EKGs ordered by me or triage Rate 137 sinus tachycardia no acute ST elevation or  depression normal axis unremarkable EKG aside from tachycardia ____________________________________________  RADIOLOGY  I reviewed any imaging ordered by me or triage that were performed during my shift and, if possible, patient and/or family made aware of any abnormal findings. ____________________________________________   PROCEDURES  Procedure(s) performed: None  Critical Care performed: None  ____________________________________________   INITIAL IMPRESSION / ASSESSMENT AND PLAN / ED COURSE  Pertinent labs & imaging results that were available during my care of the patient were reviewed by me and considered in my medical decision making (see chart for details).  Patient complains of having "a panic attack". There could be some element of alcohol withdrawal as well We will start him on IV fluids, we'll give him Ativan and reassess. She does be true persistent tachycardia or any seizure activity at or confusion or any other signs of progressive symptoms that are not immediately resolved with intervention patient might require further intervention however we will start with aggressive anxiolytics IV fluid and we will check basic blood work.  ----------------------------------------- 6:19 PM on 09/25/2015 -----------------------------------------  Patient felt much better after Ativan giving IV fluids he is calmer, still with anxiety and with mild tachycardia. Alcohol is negative sensorium remains clear blood work otherwise reassuring with no anion gap and preserved renal function. We will discuss with patient further care, heart rate this time is in the low 100s ___   ----------------------------------------- 6:33 PM on 09/25/2015 -----------------------------------------  Patient would prefer not to be admitted to the hospital, he has no evidence of tremulousness altered mental status or vomiting. He does have a mild tachycardia we are giving him fluids she states he has not had  anything to eat or drink today.  ----------------------------------------- 7:15 PM on 09/25/2015 -----------------------------------------  Patient feels that he is finally calm, his heart rate reflexes this change. Still no evidence of tremulousness or other signs of acute withdrawal. Patient would prefer to go home. He declines offered admission. He states he would like to go home he would like to drink again tonight. He declines offered rehabilitation referrals declines to stay in the hospital. At this time is no evidence of acute withdrawal and concerned about his recurrent tachycardia, but given the patient is awake and alert declines admission and has no other signs of withdrawal or altered mental status, we will discharge him home. He feels much better.  _________________________________________   FINAL CLINICAL IMPRESSION(S) / ED DIAGNOSES  Final diagnoses:  None      This chart was dictated using voice recognition software.  Despite best efforts to proofread,  errors can occur which can change meaning.     Jeanmarie Plant, MD 09/25/15 1639  Jeanmarie Plant, MD 09/25/15 1654  Jeanmarie Plant, MD 09/25/15 1820  Jeanmarie Plant, MD 09/25/15 1610  Jeanmarie Plant, MD 09/25/15 [id number]  Jeanmarie Plant, MD 09/25/15 [phone]

## 2015-09-25 NOTE — ED Notes (Signed)
Per EMS, patient comes to ED with c/o panic attacks. Patient is suppose to take 20 mg of paxil daily. Patient states he is just now getting put back on this medication after trying to "ween himself off". Originally he was on 40mg . Patient is hyperventilating, hypertensive, tachycardiac, and tachypnic. Patient c/o tingling to both hands. Patient denies HI or SI. Denies pain at this time.

## 2015-09-25 NOTE — Discharge Instructions (Signed)
Alcohol Abuse and Nutrition Alcohol abuse is any pattern of alcohol consumption that harms your health, relationships, or work. Alcohol abuse can affect how your body breaks down and absorbs nutrients from food by causing your liver to work abnormally. Additionally, many people who abuse alcohol do not eat enough carbohydrates, protein, fat, vitamins, and minerals. This can cause poor nutrition (malnutrition) and a lack of nutrients (nutrient deficiencies), which can lead to further complications. Nutrients that are commonly lacking (deficient) among people who abuse alcohol include:  Vitamins.  Vitamin A. This is stored in your liver. It is important for your vision, metabolism, and ability to fight off infections (immunity).  B vitamins. These include vitamins such as folate, thiamin, and niacin. These are important in new cell growth and maintenance.  Vitamin C. This plays an important role in iron absorption, wound healing, and immunity.  Vitamin D. This is produced by your liver, but you can also get vitamin D from food. Vitamin D is necessary for your body to absorb and use calcium.  Minerals.  Calcium. This is important for your bones and your heart and blood vessel (cardiovascular) function.  Iron. This is important for blood, muscle, and nervous system functioning.  Magnesium. This plays an important role in muscle and nerve function, and it helps to control blood sugar and blood pressure.  Zinc. This is important for the normal function of your nervous system and digestive system (gastrointestinal tract). Nutrition is an essential component of therapy for alcohol abuse. Your health care provider or dietitian will work with you to design a plan that can help restore nutrients to your body and prevent potential complications. WHAT IS MY PLAN? Your dietitian may develop a specific diet plan that is based on your condition and any other complications you may have. A diet plan will  commonly include:  A balanced diet.  Grains: 6-8 oz per day.  Vegetables: 2-3 cups per day.  Fruits: 1-2 cups per day.  Meat and other protein: 5-6 oz per day.  Dairy: 2-3 cups per day.  Vitamin and mineral supplements. WHAT DO I NEED TO KNOW ABOUT ALCOHOL AND NUTRITION?  Consume foods that are high in antioxidants, such as grapes, berries, nuts, green tea, and dark green and orange vegetables. This can help to counteract some of the stress that is placed on your liver by consuming alcohol.  Avoid food and drinks that are high in fat and sugar. Foods such as sugared soft drinks, salty snack foods, and candy contain empty calories. This means that they lack important nutrients such as protein, fiber, and vitamins.  Eat frequent meals and snacks. Try to eat 5-6 small meals each day.  Eat a variety of fresh fruits and vegetables each day. This will help you get plenty of water, fiber, and vitamins in your diet.  Drink plenty of water and other clear fluids. Try to drink at least 48-64 oz (1.5-2 L) of water per day.  If you are a vegetarian, eat a variety of protein-rich foods. Pair whole grains with plant-based proteins at meals and snacks to obtain the greatest nutrient benefit from your food. For example, eat rice with beans, put peanut butter on whole-grain toast, or eat oatmeal with sunflower seeds.  Soak beans and whole grains overnight before cooking. This can help your body to absorb the nutrients more easily.  Include foods fortified with vitamins and minerals in your diet. Commonly fortified foods include milk, orange juice, cereal, and bread.  If you  are malnourished, your dietitian may recommend a high-protein, high-calorie diet. This may include: °¨ 2,000-3,000 calories (kilocalories) per day. °¨ 70-100 grams of protein per day. °· Your health care provider may recommend a complete nutritional supplement beverage. This can help to restore calories, protein, and vitamins to  your body. Depending on your condition, you may be advised to consume this instead of or in addition to meals. °· Limit your intake of caffeine. Replace drinks like coffee and black tea with decaffeinated coffee and herbal tea. °· Eat a variety of foods that are high in omega fatty acids. These include fish, nuts and seeds, and soybeans. These foods may help your liver to recover and may also stabilize your mood. °· Certain medicines may cause changes in your appetite, taste, and weight. Work with your health care provider and dietitian to make any adjustments to your medicines and diet plan. °· Include other healthy lifestyle choices in your daily routine. °¨ Be physically active. °¨ Get enough sleep. °¨ Spend time doing activities that you enjoy. °· If you are unable to take in enough food and calories by mouth, your health care provider may recommend a feeding tube. This is a tube that passes through your nose and throat, directly into your stomach. Nutritional supplement beverages can be given to you through the feeding tube to help you get the nutrients you need. °· Take vitamin or mineral supplements as recommended by your health care provider. °WHAT FOODS CAN I EAT? °Grains °Enriched pasta. Enriched rice. Fortified whole-grain bread. Fortified whole-grain cereal. Barley. Brown rice. Quinoa. Millet. °Vegetables °All fresh, frozen, and canned vegetables. Spinach. Kale. Artichoke. Carrots. Winter squash and pumpkin. Sweet potatoes. Broccoli. Cabbage. Cucumbers. Tomatoes. Sweet peppers. Green beans. Peas. Corn. °Fruits °All fresh and frozen fruits. Berries. Grapes. Mango. Papaya. Guava. Cherries. Apples. Bananas. Peaches. Plums. Pineapple. Watermelon. Cantaloupe. Oranges. Avocado. °Meats and Other Protein Sources °Beef liver. Lean beef. Pork. Fresh and canned chicken. Fresh fish. Oysters. Sardines. Canned tuna. Shrimp. Eggs with yolks. Nuts and seeds. Peanut butter. Beans and lentils. Soybeans.  Tofu. °Dairy °Whole, low-fat, and nonfat milk. Whole, low-fat, and nonfat yogurt. Cottage cheese. Sour cream. Hard and soft cheeses. °Beverages °Water. Herbal tea. Decaffeinated coffee. Decaffeinated green tea. 100% fruit juice. 100% vegetable juice. Instant breakfast shakes. °Condiments °Ketchup. Mayonnaise. Mustard. Salad dressing. Barbecue sauce. °Sweets and Desserts °Sugar-free ice cream. Sugar-free pudding. Sugar-free gelatin. °Fats and Oils °Butter. Vegetable oil, flaxseed oil, olive oil, and walnut oil. °Other °Complete nutrition shakes. Protein bars. Sugar-free gum. °The items listed above may not be a complete list of recommended foods or beverages. Contact your dietitian for more options. °WHAT FOODS ARE NOT RECOMMENDED? °Grains °Sugar-sweetened breakfast cereals. Flavored instant oatmeal. Fried breads. °Vegetables °Breaded or deep-fried vegetables. °Fruits °Dried fruit with added sugar. Candied fruit. Canned fruit in syrup. °Meats and Other Protein Sources °Breaded or deep-fried meats. °Dairy °Flavored milks. Fried cheese curds or fried cheese sticks. °Beverages °Alcohol. Sugar-sweetened soft drinks. Sugar-sweetened tea. Caffeinated coffee and tea. °Condiments °Sugar. Honey. Agave nectar. Molasses. °Sweets and Desserts °Chocolate. Cake. Cookies. Candy. °Other °Potato chips. Pretzels. Salted nuts. Candied nuts. °The items listed above may not be a complete list of foods and beverages to avoid. Contact your dietitian for more information. °  °This information is not intended to replace advice given to you by your health care provider. Make sure you discuss any questions you have with your health care provider. °  °Document Released: 02/18/2005 Document Revised: 05/17/2014 Document Reviewed: 11/27/2013 °Elsevier Interactive Patient   Education 2016 [employer]  Generalized Anxiety Disorder Generalized anxiety disorder (GAD) is a mental disorder. It interferes with life functions, including relationships,  work, and school. GAD is different from normal anxiety, which everyone experiences at some point in their lives in response to specific life events and activities. Normal anxiety actually helps us prepare for and get through these life events and activities. Normal anxiety goes away after the event or activity is over.  GAD causes anxiety that is not necessarily related to specific events or activities. It also causes excess anxiety in proportion to specific events or activities. The anxiety associated with GAD is also difficult to control. GAD can vary from mild to severe. People with severe GAD can have intense waves of anxiety with physical symptoms (panic attacks).  SYMPTOMS The anxiety and worry associated with GAD are difficult to control. This anxiety and worry are related to many life events and activities and also occur more days than not for 6 months or longer. People with GAD also have three or more of the following symptoms (one or more in children):  Restlessness.   Fatigue.  Difficulty concentrating.   Irritability.  Muscle tension.  Difficulty sleeping or unsatisfying sleep. DIAGNOSIS GAD is diagnosed through an assessment by your health care provider. Your health care provider will ask you questions aboutyour mood,physical symptoms, and events in your life. Your health care provider may ask you about your medical history and use of alcohol or drugs, including prescription medicines. Your health care provider may also do a physical exam and blood tests. Certain medical conditions and the use of certain substances can cause symptoms similar to those associated with GAD. Your health care provider may refer you to a mental health specialist for further evaluation. TREATMENT The following therapies are usually used to treat GAD:   Medication. Antidepressant medication usually is prescribed for long-term daily control. Antianxiety medicines may be added in severe cases, especially  when panic attacks occur.   Talk therapy (psychotherapy). Certain types of talk therapy can be helpful in treating GAD by providing support, education, and guidance. A form of talk therapy called cognitive behavioral therapy can teach you healthy ways to think about and react to daily life events and activities.  Stress managementtechniques. These include yoga, meditation, and exercise and can be very helpful when they are practiced regularly. A mental health specialist can help determine which treatment is best for you. Some people see improvement with one therapy. However, other people require a combination of therapies.   This information is not intended to replace advice given to you by your health care provider. Make sure you discuss any questions you have with your health care provider.   Document Released: 08/21/2012 Document Revised: 05/17/2014 Document Reviewed: 08/21/2012 Elsevier Interactive Patient Education [employer]2016 [employer]

## 2015-09-30 ENCOUNTER — Emergency Department
Admission: EM | Admit: 2015-09-30 | Discharge: 2015-09-30 | Disposition: A | Discharge status: Home / Self Care | Payer: Self-pay | Attending: Emergency Medicine | Admitting: Emergency Medicine

## 2015-09-30 ENCOUNTER — Encounter: Payer: Self-pay | Admitting: Emergency Medicine

## 2015-09-30 DIAGNOSIS — Z5321 Procedure and treatment not carried out due to patient leaving prior to being seen by health care provider: Secondary | ICD-10-CM | POA: Insufficient documentation

## 2015-09-30 DIAGNOSIS — F410 Panic disorder [episodic paroxysmal anxiety] without agoraphobia: Secondary | ICD-10-CM | POA: Insufficient documentation

## 2015-09-30 HISTORY — DX: Panic disorder (episodic paroxysmal anxiety): F41.0

## 2015-09-30 NOTE — ED Notes (Signed)
Patient presents to the ED stating, "I came for a panic attack but it seems like it's getting better now.  I don't think I'll need the Iv's and stuff."  Patient reports history of panic attacks and states he recently was put back on anxiety medication which has been helping somewhat.  Patient states he takes paroxetine and buspirone.  Patient has a black right eye but states that occurred several day ago.  Patient is in no obvious distress at this time.

## 2017-09-25 ENCOUNTER — Other Ambulatory Visit: Payer: Self-pay

## 2017-09-25 ENCOUNTER — Emergency Department
Admission: EM | Admit: 2017-09-25 | Discharge: 2017-09-25 | Disposition: A | Discharge status: Home / Self Care | Payer: Self-pay | Attending: Emergency Medicine | Admitting: Emergency Medicine

## 2017-09-25 DIAGNOSIS — M545 Low back pain, unspecified: Secondary | ICD-10-CM

## 2017-09-25 DIAGNOSIS — F17210 Nicotine dependence, cigarettes, uncomplicated: Secondary | ICD-10-CM | POA: Insufficient documentation

## 2017-09-25 DIAGNOSIS — R82998 Other abnormal findings in urine: Secondary | ICD-10-CM

## 2017-09-25 LAB — COMPREHENSIVE METABOLIC PANEL
ALK PHOS: 117 U/L (ref 38–126)
ALT: 108 U/L — AB (ref 17–63)
ANION GAP: 11 (ref 5–15)
AST: 92 U/L — AB (ref 15–41)
Albumin: 4.9 g/dL (ref 3.5–5.0)
BUN: 8 mg/dL (ref 6–20)
CALCIUM: 9.2 mg/dL (ref 8.9–10.3)
CO2: 24 mmol/L (ref 22–32)
CREATININE: 0.86 mg/dL (ref 0.61–1.24)
Chloride: 100 mmol/L — ABNORMAL LOW (ref 101–111)
GFR calc Af Amer: >60 mL/min (ref >60)
GFR calc non Af Amer: >60 mL/min (ref >60)
Glucose, Bld: 94 mg/dL (ref 65–99)
Potassium: 3.5 mmol/L (ref 3.5–5.1)
SODIUM: 135 mmol/L (ref 135–145)
Total Bilirubin: 2 mg/dL — ABNORMAL HIGH (ref 0.3–1.2)
Total Protein: 8.6 g/dL — ABNORMAL HIGH (ref 6.5–8.1)

## 2017-09-25 LAB — CBC WITH DIFFERENTIAL/PLATELET
BASOS PCT: 1 %
Basophils Absolute: 0.1 10*3/uL (ref 0–0.1)
Eosinophils Absolute: 0.1 10*3/uL (ref 0–0.7)
Eosinophils Relative: 1 %
HEMATOCRIT: 49.7 % (ref 40.0–52.0)
Hemoglobin: 17.5 g/dL (ref 13.0–18.0)
Lymphocytes Relative: 33 %
Lymphs Abs: 3.7 10*3/uL — ABNORMAL HIGH (ref 1.0–3.6)
MCH: 33.4 pg (ref 26.0–34.0)
MCHC: 35.1 g/dL (ref 32.0–36.0)
MCV: 95.2 fL (ref 80.0–100.0)
MONO ABS: 1 10*3/uL (ref 0.2–1.0)
MONOS PCT: 9 %
NEUTROS ABS: 6.3 10*3/uL (ref 1.4–6.5)
Neutrophils Relative %: 56 %
Platelets: 280 10*3/uL (ref 150–440)
RBC: 5.23 MIL/uL (ref 4.40–5.90)
RDW: 13.3 % (ref 11.5–14.5)
WBC: 11.2 10*3/uL — ABNORMAL HIGH (ref 3.8–10.6)

## 2017-09-25 LAB — URINALYSIS, ROUTINE W REFLEX MICROSCOPIC
Bilirubin Urine: NEGATIVE
Glucose, UA: NEGATIVE mg/dL
Hgb urine dipstick: NEGATIVE
Ketones, ur: NEGATIVE mg/dL
LEUKOCYTES UA: NEGATIVE
Nitrite: NEGATIVE
PH: 6 (ref 5.0–8.0)
Protein, ur: NEGATIVE mg/dL
SPECIFIC GRAVITY, URINE: 1.001 — AB (ref 1.005–1.030)

## 2017-09-25 NOTE — ED Provider Notes (Signed)
[facility] Emergency Department Provider Note  ____________________________________________  Time seen: Approximately 10:16 PM  I have reviewed the triage vital signs and the nursing notes.   HISTORY  Chief Complaint Hematuria and Back Pain   HPI Gilbert Alvarez is a 32 y.o. male with a history of alcohol abuse, anxiety, depression who presents for evaluation of left lower back pain.  Patient reports that he is an alcoholic and drinks 12-15 beers a day.  Last drink was 2 hours ago.  Over the last 4 days he has had pain in his left lower back and dark urine.  He describes the pain as make, mild, constant and nonradiating.  Patient reports that the pain started after he helped a friend lift some heavy boxes.  He was concerned that he had a urinary tract infection.  He thought the dark urine was blood.  He denies fever chills, abdominal pain, nausea or vomiting, constipation or diarrhea, he denies dysuria.   Past Medical History:  Diagnosis Date  . Anxiety   . Depression   . Panic attacks      Allergies Tramadol  FH Father - cirrhosis  Social History Social History   Tobacco Use  . Smoking status: Current Every Day Smoker    Packs/day: 1.00    Types: Cigarettes  . Smokeless tobacco: Current User  Substance Use Topics  . Alcohol use: Yes    Alcohol/week: 7.2 oz    Types: 12 Cans of beer per week    Comment: everynight  . Drug use: No    Review of Systems  Constitutional: Negative for fever. Eyes: Negative for visual changes. ENT: Negative for sore throat. Neck: No neck pain  Cardiovascular: Negative for chest pain. Respiratory: Negative for shortness of breath. Gastrointestinal: Negative for abdominal pain, vomiting or diarrhea. Genitourinary: Negative for dysuria. Musculoskeletal: Negative for back pain. + Left lower back pain Skin: Negative for rash. Neurological: Negative for headaches, weakness or numbness. Psych: No SI or  HI  ____________________________________________   PHYSICAL EXAM:  VITAL SIGNS: ED Triage Vitals  Enc Vitals Group     BP 09/25/17 2131 (!) 146/106     Pulse Rate 09/25/17 2131 98     Resp 09/25/17 2131 17     Temp 09/25/17 2131 97.9 F (36.6 C)     Temp Source 09/25/17 2131 Oral     SpO2 09/25/17 2131 97 %     Weight 09/25/17 2132 155 lb (70.3 kg)     Height 09/25/17 2132  (1.676 m)     Head Circumference --      Peak Flow --      Pain Score 09/25/17 2132 2     Pain Loc --      Pain Edu? --      Excl. in GC? --     Constitutional: Alert and oriented. Well appearing and in no apparent distress. HEENT:      Head: Normocephalic and atraumatic.         Eyes: Conjunctivae are normal. Sclera is non-icteric.       Mouth/Throat: Mucous membranes are moist.       Neck: Supple with no signs of meningismus. Cardiovascular: Regular rate and rhythm. No murmurs, gallops, or rubs. 2+ symmetrical distal pulses are present in all extremities. No JVD. Respiratory: Normal respiratory effort. Lungs are clear to auscultation bilaterally. No wheezes, crackles, or rhonchi.  Gastrointestinal: Soft, non tender, and non distended with positive bowel sounds. No rebound  or guarding. Genitourinary: No CVA tenderness.  Musculoskeletal: Nontender with normal range of motion in all extremities. No edema, cyanosis, or erythema of extremities.  No CT NL mid spine tenderness Neurologic: Normal speech and language. Face is symmetric. Moving all extremities. No gross focal neurologic deficits are appreciated. Skin: Skin is warm, dry and intact. No rash noted. Psychiatric: Mood and affect are normal. Speech and behavior are normal.  ____________________________________________   LABS (all labs ordered are listed, but only abnormal results are displayed)  Labs Reviewed  URINALYSIS, ROUTINE W REFLEX MICROSCOPIC - Abnormal; Notable for the following components:      Result Value   Color, Urine  COLORLESS (*)    APPearance CLEAR (*)    Specific Gravity, Urine 1.001 (*)    All other components within normal limits  COMPREHENSIVE METABOLIC PANEL - Abnormal; Notable for the following components:   Chloride 100 (*)    Total Protein 8.6 (*)    AST 92 (*)    ALT 108 (*)    Total Bilirubin 2.0 (*)    All other components within normal limits  CBC WITH DIFFERENTIAL/PLATELET - Abnormal; Notable for the following components:   WBC 11.2 (*)    Lymphs Abs 3.7 (*)    All other components within normal limits   ____________________________________________  EKG  none  ____________________________________________  RADIOLOGY  none  ____________________________________________   PROCEDURES  Procedure(s) performed: None Procedures Critical Care performed:  None ____________________________________________   INITIAL IMPRESSION / ASSESSMENT AND PLAN / ED COURSE  32 y.o. male with a history of alcohol abuse, anxiety, depression who presents for evaluation of mild achy left lower back pain x 4 days but started after patient helped a friend move some heavy boxes.  Patient is extremely well-appearing, abdomen is soft, no CVA tenderness, no CT and L-spine tenderness, he points to the left lower back as the place of his pain which is currently 2 out of 10 and that is what he has been for 4 days.  He has no clinical signs or symptoms of cauda equina, testicular torsion, appendicitis, cholecystitis, kidney stone.  His labs show normal urinalysis with no evidence of UTI or blood.  Patient has mildly elevated LFTs which is new when compared to prior from 2017.  Discussed with the patient the dangers of alcohol abuse and possible development of cirrhosis.  Patient reports that he will try to stop drinking.  He has no abdominal tenderness.  I offered a CT abdomen pelvis however patient would like to hold off at this time since his pain is so mild and he will treated with NSAIDs and heat pads.  We  discussed that he will return to the emergency room or see primary care doctor if the pain is not resolved for the next 2 days or if he develops any new or worsening pain in his back, abdomen, numbness or weakness of his legs.      As part of my medical decision making, I reviewed the following data within the electronic MEDICAL RECORD NUMBER Nursing notes reviewed and incorporated, Labs reviewed , Old chart reviewed, Notes from prior ED visits and [place] Controlled Substance Database    Pertinent labs & imaging results that were available during my care of the patient were reviewed by me and considered in my medical decision making (see chart for details).    ____________________________________________   FINAL CLINICAL IMPRESSION(S) / ED DIAGNOSES  Final diagnoses:  Acute left-sided low back pain without sciatica  Dark urine      NEW MEDICATIONS STARTED DURING THIS VISIT:  ED Discharge Orders    None       Note:  This document was prepared using Dragon voice recognition software and may include unintentional dictation errors.    Nita Sickle, MD 09/25/17 2237
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
## 2017-10-27 ENCOUNTER — Emergency Department

## 2017-10-27 ENCOUNTER — Emergency Department
Admission: EM | Admit: 2017-10-27 | Discharge: 2017-10-27 | Disposition: A | Discharge status: Home / Self Care | Attending: Emergency Medicine | Admitting: Emergency Medicine

## 2017-10-27 ENCOUNTER — Other Ambulatory Visit: Payer: Self-pay

## 2017-10-27 DIAGNOSIS — Y999 Unspecified external cause status: Secondary | ICD-10-CM | POA: Diagnosis not present

## 2017-10-27 DIAGNOSIS — Y929 Unspecified place or not applicable: Secondary | ICD-10-CM | POA: Diagnosis not present

## 2017-10-27 DIAGNOSIS — Y939 Activity, unspecified: Secondary | ICD-10-CM | POA: Insufficient documentation

## 2017-10-27 DIAGNOSIS — S0990XA Unspecified injury of head, initial encounter: Secondary | ICD-10-CM | POA: Diagnosis present

## 2017-10-27 DIAGNOSIS — F17210 Nicotine dependence, cigarettes, uncomplicated: Secondary | ICD-10-CM | POA: Diagnosis not present

## 2017-10-27 DIAGNOSIS — S0083XA Contusion of other part of head, initial encounter: Secondary | ICD-10-CM | POA: Diagnosis not present
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
## 2017-10-27 NOTE — ED Notes (Signed)
Patient transported to CT 

## 2018-06-30 ENCOUNTER — Other Ambulatory Visit: Payer: Self-pay

## 2018-06-30 DIAGNOSIS — F17210 Nicotine dependence, cigarettes, uncomplicated: Secondary | ICD-10-CM | POA: Insufficient documentation

## 2018-06-30 DIAGNOSIS — F419 Anxiety disorder, unspecified: Secondary | ICD-10-CM | POA: Insufficient documentation

## 2018-06-30 DIAGNOSIS — G4730 Sleep apnea, unspecified: Secondary | ICD-10-CM | POA: Insufficient documentation

## 2018-06-30 NOTE — ED Triage Notes (Signed)
Pt states for a week, he is sleeping, he awakes suddenly, feels like he is "gasping and can't breathe". Pt anxious, states has a history of anxiety and smoking.

## 2018-07-01 ENCOUNTER — Emergency Department
Admission: EM | Admit: 2018-07-01 | Discharge: 2018-07-01 | Disposition: A | Discharge status: Home / Self Care | Attending: Emergency Medicine | Admitting: Emergency Medicine

## 2018-07-01 ENCOUNTER — Emergency Department

## 2018-07-01 ENCOUNTER — Other Ambulatory Visit

## 2018-07-01 DIAGNOSIS — G4730 Sleep apnea, unspecified: Secondary | ICD-10-CM

## 2018-07-01 DIAGNOSIS — F419 Anxiety disorder, unspecified: Secondary | ICD-10-CM

## 2018-07-01 MED ORDER — HYDROXYZINE HCL 25 MG PO TABS
50.0000 mg | ORAL_TABLET | Freq: Once | ORAL | Status: AC
  Administered 2018-07-01: 50 mg via ORAL
  Filled 2018-07-01: qty 2

## 2018-07-01 MED ORDER — HYDROXYZINE HCL 50 MG PO TABS: 50.0000 mg | ORAL_TABLET | Freq: Every evening | ORAL | 0 refills | Status: DC | PRN

## 2018-07-01 NOTE — Discharge Instructions (Addendum)
Take Vistaril as needed to sleep. Return to ER for worsening symptoms, persistent vomiting, difficulty breathing or other concerns.

## 2018-07-01 NOTE — ED Provider Notes (Signed)
[facility] Emergency Department Provider Note   ____________________________________________   First MD Initiated Contact with Patient 07/01/18 [phone]     (approximate)  I have reviewed the triage vital signs and the nursing notes.   HISTORY  Chief Complaint gasping for breath when sleeping    HPI Gilbert Alvarez is a 33 y.o. male who presents to the ED from home with a chief complaint of anxiety and difficulty breathing.  Patient states he has a history of anxiety, takes Paxil.  Currently unemployed and has stressed concerning finances.  He is also getting over a cold.  For the past week, he awakes from sleep feeling like he is "gasping for air and cannot breathe".  Has been told he snores loudly.  Denies recent fever, chills, chest pain, abdominal pain, nausea, vomiting, diarrhea.  Denies recent travel or trauma.  Denies active SI/HI/AH/VH.    Past Medical History:  Diagnosis Date  . Anxiety   . Depression   . Panic attacks     There are no active problems to display for this patient.   No past surgical history on file.  Prior to Admission medications   Medication Sig Start Date End Date Taking? Authorizing Provider  hydrOXYzine (ATARAX/VISTARIL) 50 MG tablet Take 1 tablet (50 mg total) by mouth at bedtime as needed for anxiety. 07/01/18   Irean Hong, MD    Allergies Tramadol  No family history on file.  Social History Social History   Tobacco Use  . Smoking status: Current Every Day Smoker    Packs/day: 1.00    Types: Cigarettes  . Smokeless tobacco: Current User  Substance Use Topics  . Alcohol use: Yes    Alcohol/week: 12.0 standard drinks    Types: 12 Cans of beer per week    Comment: everynight  . Drug use: No    Review of Systems  Constitutional: No fever/chills Eyes: No visual changes. ENT: No sore throat. Cardiovascular: Denies chest pain. Respiratory: Positive for shortness of breath. Gastrointestinal: No abdominal  pain.  No nausea, no vomiting.  No diarrhea.  No constipation. Genitourinary: Negative for dysuria. Musculoskeletal: Negative for back pain. Skin: Negative for rash. Neurological: Negative for headaches, focal weakness or numbness. Psychiatric: Positive for anxiety.  ____________________________________________   PHYSICAL EXAM:  VITAL SIGNS: ED Triage Vitals [06/30/18 2352]  Enc Vitals Group     BP (!) 159/99     Pulse Rate (!) 102     Resp 16     Temp (!) 97.5 F (36.4 C)     Temp Source Oral     SpO2 100 %     Weight 160 lb (72.6 kg)     Height 5\' 6"  (1.676 m)     Head Circumference      Peak Flow      Pain Score 0     Pain Loc      Pain Edu?      Excl. in GC?     Constitutional: Alert and oriented. Well appearing and in no acute distress. Eyes: Conjunctivae are normal. PERRL. EOMI. Head: Atraumatic. Nose: No congestion/rhinnorhea. Mouth/Throat: Mucous membranes are moist.  Oropharynx non-erythematous. Neck: No stridor.   Cardiovascular: Normal rate, regular rhythm. Grossly normal heart sounds.  Good peripheral circulation. Respiratory: Normal respiratory effort.  No retractions. Lungs CTAB. Gastrointestinal: Soft and nontender. No distention. No abdominal bruits. No CVA tenderness. Musculoskeletal: No lower extremity tenderness nor edema.  No joint effusions. Neurologic:  Normal speech and  language. No gross focal neurologic deficits are appreciated. No gait instability. Skin:  Skin is warm, dry and intact. No rash noted. Psychiatric: Mood and affect are mildly anxious. Speech and behavior are normal.  ____________________________________________   LABS (all labs ordered are listed, but only abnormal results are displayed)  Labs Reviewed - No data to display ____________________________________________  EKG  None ____________________________________________  RADIOLOGY  ED MD interpretation: No acute cardiopulmonary process  Official radiology  report(s): Dg Chest 2 View  Result Date: 07/01/2018 CLINICAL DATA:  33 y/o  M; feels like he can't breathe during sleep. EXAM: CHEST - 2 VIEW COMPARISON:  09/07/2015 chest radiograph. FINDINGS: Stable heart size and mediastinal contours are within normal limits. Both lungs are clear. The visualized skeletal structures are unremarkable. IMPRESSION: No active cardiopulmonary disease. Electronically Signed   By: Mitzi Hansen M.D.   On: 07/01/2018 01:27    ____________________________________________   PROCEDURES  Procedure(s) performed: None  Procedures  Critical Care performed: No  ____________________________________________   INITIAL IMPRESSION / ASSESSMENT AND PLAN / ED COURSE  As part of my medical decision making, I reviewed the following data within the electronic MEDICAL RECORD NUMBER Nursing notes reviewed and incorporated, Old chart reviewed, Radiograph reviewed and Notes from prior ED visits    33 year old male who presents with symptoms sounding like obstructive sleep apnea.  Also has anxiety.  Will prescribe Vistaril and refer patient to pulmonology for sleep study.  Strict return precautions given.  Patient verbalizes understanding and agrees with plan of care.      ____________________________________________   FINAL CLINICAL IMPRESSION(S) / ED DIAGNOSES  Final diagnoses:  Sleep apnea, unspecified type  Anxiety     ED Discharge Orders         Ordered    hydrOXYzine (ATARAX/VISTARIL) 50 MG tablet  At bedtime PRN     07/01/18 0144           Note:  This document was prepared using Dragon voice recognition software and may include unintentional dictation errors.   Irean Hong, MD 07/01/18 0200

## 2018-09-16 ENCOUNTER — Encounter: Payer: Self-pay | Admitting: Emergency Medicine

## 2018-09-16 ENCOUNTER — Other Ambulatory Visit: Payer: Self-pay

## 2018-09-16 DIAGNOSIS — F17210 Nicotine dependence, cigarettes, uncomplicated: Secondary | ICD-10-CM | POA: Insufficient documentation

## 2018-09-16 DIAGNOSIS — F419 Anxiety disorder, unspecified: Secondary | ICD-10-CM | POA: Insufficient documentation

## 2018-09-16 DIAGNOSIS — Z79899 Other long term (current) drug therapy: Secondary | ICD-10-CM | POA: Insufficient documentation

## 2018-09-16 LAB — CBC
HCT: 50.1 % (ref 39.0–52.0)
Hemoglobin: 18.1 g/dL — ABNORMAL HIGH (ref 13.0–17.0)
MCH: 33.3 pg (ref 26.0–34.0)
MCHC: 36.1 g/dL — ABNORMAL HIGH (ref 30.0–36.0)
MCV: 92.3 fL (ref 80.0–100.0)
Platelets: 269 10*3/uL (ref 150–400)
RBC: 5.43 MIL/uL (ref 4.22–5.81)
RDW: 12.1 % (ref 11.5–15.5)
WBC: 13 10*3/uL — ABNORMAL HIGH (ref 4.0–10.5)
nRBC: 0 % (ref 0.0–0.2)

## 2018-09-16 LAB — COMPREHENSIVE METABOLIC PANEL
ALT: 89 U/L — ABNORMAL HIGH (ref 0–44)
AST: 91 U/L — ABNORMAL HIGH (ref 15–41)
Albumin: 4.9 g/dL (ref 3.5–5.0)
Alkaline Phosphatase: 136 U/L — ABNORMAL HIGH (ref 38–126)
Anion gap: 14 (ref 5–15)
BUN: 6 mg/dL (ref 6–20)
CO2: 21 mmol/L — ABNORMAL LOW (ref 22–32)
Calcium: 9.7 mg/dL (ref 8.9–10.3)
Chloride: 100 mmol/L (ref 98–111)
Creatinine, Ser: 0.71 mg/dL (ref 0.61–1.24)
GFR calc Af Amer: >60 mL/min (ref >60)
GFR calc non Af Amer: >60 mL/min (ref >60)
Glucose, Bld: 111 mg/dL — ABNORMAL HIGH (ref 70–99)
Potassium: 3.5 mmol/L (ref 3.5–5.1)
Sodium: 135 mmol/L (ref 135–145)
Total Bilirubin: 2.7 mg/dL — ABNORMAL HIGH (ref 0.3–1.2)
Total Protein: 9.3 g/dL — ABNORMAL HIGH (ref 6.5–8.1)

## 2018-09-16 LAB — ETHANOL: Alcohol, Ethyl (B): 22 mg/dL — ABNORMAL HIGH (ref <10)

## 2018-09-16 NOTE — ED Notes (Signed)
Patient to ED waiting room by EMS for anxiety.  EMS initial blood pressure was 190/130 after talking with patient BP 135/80 and heart rate 100.

## 2018-09-16 NOTE — ED Notes (Signed)
Patient given a warm blanket at this time.  

## 2018-09-16 NOTE — ED Triage Notes (Addendum)
Patient states that he has has 2 panic attacks today. Patient states that he has medication for his panic attacks but has missed a few days. Patient states that he drink 12- 15 beers daily.

## 2018-09-17 ENCOUNTER — Emergency Department
Admission: EM | Admit: 2018-09-17 | Discharge: 2018-09-17 | Disposition: A | Discharge status: Home / Self Care | Payer: Self-pay | Attending: Emergency Medicine | Admitting: Emergency Medicine

## 2018-09-17 DIAGNOSIS — F419 Anxiety disorder, unspecified: Secondary | ICD-10-CM

## 2018-09-17 LAB — URINE DRUG SCREEN, QUALITATIVE (ARMC ONLY)
Amphetamines, Ur Screen: NOT DETECTED
Barbiturates, Ur Screen: NOT DETECTED
Benzodiazepine, Ur Scrn: NOT DETECTED
Cannabinoid 50 Ng, Ur ~~LOC~~: NOT DETECTED
Cocaine Metabolite,Ur ~~LOC~~: NOT DETECTED
MDMA (Ecstasy)Ur Screen: NOT DETECTED
Methadone Scn, Ur: NOT DETECTED
Opiate, Ur Screen: NOT DETECTED
Phencyclidine (PCP) Ur S: NOT DETECTED
Tricyclic, Ur Screen: NOT DETECTED

## 2018-09-17 MED ORDER — HYDROXYZINE HCL 25 MG PO TABS: 25.0000 mg | ORAL_TABLET | Freq: Three times a day (TID) | ORAL | 0 refills | Status: DC | PRN

## 2018-09-17 MED ORDER — LORAZEPAM 1 MG PO TABS
1.0000 mg | ORAL_TABLET | Freq: Once | ORAL | Status: AC
  Administered 2018-09-17: 05:00:00 1 mg via ORAL
  Filled 2018-09-17: qty 1

## 2018-09-17 NOTE — ED Provider Notes (Signed)
Gilbert Alvarez  Time seen: 4:08 AM  I have reviewed the triage vital signs and the nursing notes.   HISTORY  Chief Complaint Panic Attack   HPI Gilbert Alvarez is a 33 y.o. male with a past medical history of anxiety, depression, panic attacks, presents to the emergency department for anxiety.  According to the patient throughout the day yesterday he was feeling very anxious and several panic attacks per patient.  States he checked his blood pressure at one point it was 178 systolic which made him very scared and worsened anxiety/panic attacks.  Patient states he takes paroxetine each day for anxiety, but has forgotten over the past several days.  Patient had hydroxyzine for acute panic attacks however states the medication expired several months ago so he did not take it.  Patient states he is not been able to sleep tonight because of the anxiety so he came to the emergency department hoping for help.  Patient denies any fever cough or congestion.  Patient does admit to alcohol on a daily basis.  Past Medical History:  Diagnosis Date  . Anxiety   . Depression   . Panic attacks     There are no active problems to display for this patient.   History reviewed. No pertinent surgical history.  Prior to Admission medications   Medication Sig Start Date End Date Taking? Authorizing Provider  hydrOXYzine (ATARAX/VISTARIL) 50 MG tablet Take 1 tablet (50 mg total) by mouth at bedtime as needed for anxiety. 07/01/18   Irean HongSung, Jade J, MD    Allergies  Allergen Reactions  . Tramadol     No family history on file.  Social History Social History   Tobacco Use  . Smoking status: Current Every Day Smoker    Packs/day: 1.00    Types: Cigarettes  . Smokeless tobacco: Current User  Substance Use Topics  . Alcohol use: Yes    Alcohol/week: 12.0 standard drinks    Types: 12 Cans of beer per week    Comment: everynight  . Drug use: No     Review of Systems Constitutional: Negative for fever. Cardiovascular: Negative for chest pain. Respiratory: Negative for shortness of breath.  Negative for cough. Gastrointestinal: Negative for abdominal pain, vomiting Musculoskeletal: Negative for musculoskeletal complaints Skin: Negative for skin complaints  Neurological: Negative for headache All other ROS negative  ____________________________________________   PHYSICAL EXAM:  VITAL SIGNS: ED Triage Vitals  Enc Vitals Group     BP 09/16/18 2241 (!) 170/115     Pulse Rate 09/16/18 2241 (!) 111     Resp 09/16/18 2241 18     Temp 09/16/18 2241 98.3 F (36.8 C)     Temp Source 09/16/18 2241 Oral     SpO2 09/16/18 2241 97 %     Weight 09/16/18 2242 160 lb (72.6 kg)     Height 09/16/18 2242 5\' 7"  (1.702 m)     Head Circumference --      Peak Flow --      Pain Score 09/16/18 2242 0     Pain Loc --      Pain Edu? --      Excl. in GC? --    Constitutional: Alert and oriented.  Patient is initially pacing around the room, but will sit on the bed and is able to somewhat relax. Eyes: Normal exam ENT      Head: Normocephalic and atraumatic      Mouth/Throat: Mucous  membranes are moist. Cardiovascular: Normal rate, regular rhythm Respiratory: Normal respiratory effort without tachypnea nor retractions. Breath sounds are clear  Gastrointestinal: Soft and nontender. No distention.  Musculoskeletal: Nontender with normal range of motion in all extremities. Neurologic:  Normal speech and language. No gross focal neurologic deficits Skin:  Skin is warm, dry and intact.  Psychiatric: Mood and affect are normal.  ____________________________________________   INITIAL IMPRESSION / ASSESSMENT AND PLAN / ED COURSE  Pertinent labs & imaging results that were available during my care of the patient were reviewed by me and considered in my medical decision making (see chart for details).   Patient presents to the emergency  department for symptoms suggestive of anxiety/panic disorder.  Patient admits to frequent panic attacks, states he is been very anxious throughout the day, states he is feeling like he is having a panic attack so he checked his blood pressure and it was elevated at 178 which scared him, he states he thought he was going to die so he came to the emergency department.  Here the patient appears well, he is anxious appearing but otherwise well.  Patient's lab work shows elevated LFTs.  I discussed this with the patient and he admits to daily alcohol use.  We will refill the patient's hydroxyzine.  We will dose a one-time dose of Ativan in the emergency department to help the patient with his anxiety and hopefully to help him get some sleep tonight.  Patient agreeable to plan of care and will follow-up with his doctor.  Gilbert Alvarez was evaluated in Emergency Department on 09/17/2018 for the symptoms described in the history of present illness. He was evaluated in the context of the global COVID-19 pandemic, which necessitated consideration that the patient might be at risk for infection with the SARS-CoV-2 virus that causes COVID-19. Institutional protocols and algorithms that pertain to the evaluation of patients at risk for COVID-19 are in a state of rapid change based on information released by regulatory bodies including the CDC and federal and state organizations. These policies and algorithms were followed during the patient's care in the ED.  ____________________________________________   FINAL CLINICAL IMPRESSION(S) / ED DIAGNOSES  Anxiety   Minna Antis, MD 09/17/18 0425

## 2018-10-07 ENCOUNTER — Other Ambulatory Visit: Payer: Self-pay

## 2018-10-07 DIAGNOSIS — M7031 Other bursitis of elbow, right elbow: Secondary | ICD-10-CM | POA: Insufficient documentation

## 2018-10-07 DIAGNOSIS — F17210 Nicotine dependence, cigarettes, uncomplicated: Secondary | ICD-10-CM | POA: Insufficient documentation

## 2018-10-07 DIAGNOSIS — M25521 Pain in right elbow: Secondary | ICD-10-CM | POA: Insufficient documentation

## 2018-10-07 DIAGNOSIS — Y939 Activity, unspecified: Secondary | ICD-10-CM | POA: Insufficient documentation

## 2018-10-07 NOTE — ED Triage Notes (Signed)
Patient reports he had injury to right elbow in January and reports has re injured it on concrete and now with swelling to area.

## 2018-10-08 ENCOUNTER — Emergency Department: Payer: Self-pay

## 2018-10-08 ENCOUNTER — Emergency Department
Admission: EM | Admit: 2018-10-08 | Discharge: 2018-10-08 | Disposition: A | Discharge status: Home / Self Care | Payer: Self-pay | Attending: Student in an Organized Health Care Education/Training Program | Admitting: Student in an Organized Health Care Education/Training Program

## 2018-10-08 DIAGNOSIS — M25521 Pain in right elbow: Secondary | ICD-10-CM

## 2018-10-08 DIAGNOSIS — M7031 Other bursitis of elbow, right elbow: Secondary | ICD-10-CM

## 2018-10-08 NOTE — ED Provider Notes (Signed)
[facility] Emergency Department Provider Note    First MD Initiated Contact with Patient 10/08/18 [phone]     (approximate)  I have reviewed the triage vital signs and the nursing notes.   HISTORY  Chief Complaint Joint Swelling    HPI Gilbert Alvarez is a 33 y.o. male presents the ER for evaluation of right elbow pain and swelling.  Has had issues with right elbow pain in the past of trauma.  Fell on hit the elbow on concrete.  Denies any other injury or pain.    Past Medical History:  Diagnosis Date  . Anxiety   . Depression   . Panic attacks    No family history on file. No past surgical history on file. There are no active problems to display for this patient.     Prior to Admission medications   Medication Sig Start Date End Date Taking? Authorizing Provider  hydrOXYzine (ATARAX/VISTARIL) 25 MG tablet Take 1 tablet (25 mg total) by mouth 3 (three) times daily as needed for anxiety. 09/17/18   Minna Antis, MD    Allergies Tramadol    Social History Social History   Tobacco Use  . Smoking status: Current Every Day Smoker    Packs/day: 1.00    Types: Cigarettes  . Smokeless tobacco: Current User  Substance Use Topics  . Alcohol use: Yes    Alcohol/week: 12.0 standard drinks    Types: 12 Cans of beer per week    Comment: everynight  . Drug use: No    Review of Systems Patient denies headaches, rhinorrhea, blurry vision, numbness, shortness of breath, chest pain, edema, cough, abdominal pain, nausea, vomiting, diarrhea, dysuria, fevers, rashes or hallucinations unless otherwise stated above in HPI. ____________________________________________   PHYSICAL EXAM:  VITAL SIGNS: Vitals:   10/08/18 0000 10/08/18 0430  BP: (!) 157/114 (!) 158/99  Pulse: (!) 111 99  Resp: 18 18  Temp: 98.3 F (36.8 C) 98.6 F (37 C)  SpO2: 97% 99%    Constitutional: Alert and oriented. Well appearing and in no acute distress. Eyes:  Conjunctivae are normal.  Head: Atraumatic. Nose: No congestion/rhinnorhea. Mouth/Throat: Mucous membranes are moist.   Neck: Painless ROM.  Cardiovascular:   Good peripheral circulation. Respiratory: Normal respiratory effort.  No retractions.  Gastrointestinal: Soft and nontender.  Musculoskeletal: Does have swollen right bursa the elbow without overlying erythema.  He is able to fully extend and flex the elbow.  No bony tenderness palpation.  No lower extremity tenderness .  No joint effusions. Neurologic:  Normal speech and language. No gross focal neurologic deficits are appreciated.  Skin:  Skin is warm, dry and intact. No rash noted. Psychiatric: Mood and affect are normal. Speech and behavior are normal.  ____________________________________________   LABS (all labs ordered are listed, but only abnormal results are displayed)  No results found for this or any previous visit (from the past 24 hour(s)). ____________________________________________ ____________________________________________  RADIOLOGY  I personally reviewed all radiographic images ordered to evaluate for the above acute complaints and reviewed radiology reports and findings.  These findings were personally discussed with the patient.  Please see medical record for radiology report.  ____________________________________________   PROCEDURES  Procedure(s) performed:  Procedures    Critical Care performed: no ____________________________________________   INITIAL IMPRESSION / ASSESSMENT AND PLAN / ED COURSE  Pertinent labs & imaging results that were available during my care of the patient were reviewed by me and considered in my medical decision making (  see chart for details).  DDX: fracture, dislocation, contusion, bursitits  [name] is a 33 y.o. who presents to the ED with elbow pain as described above.  He is afebrile hemodynamically stable.  No other evidence of traumatic injury.  X-ray  without evidence of fracture.  Sling provided for comfort.  Discussed elevation rest and anti-inflammatories and follow-up with orthopedics.  Discussed signs and symptoms for which she should return to the ER.     The patient was evaluated in Emergency Department today for the symptoms described in the history of present illness. He/she was evaluated in the context of the global COVID-19 pandemic, which necessitated consideration that the patient might be at risk for infection with the SARS-CoV-2 virus that causes COVID-19. Institutional protocols and algorithms that pertain to the evaluation of patients at risk for COVID-19 are in a state of rapid change based on information released by regulatory bodies including the CDC and federal and state organizations. These policies and algorithms were followed during the patient's care in the ED.    ____________________________________________   FINAL CLINICAL IMPRESSION(S) / ED DIAGNOSES  Final diagnoses:  Elbow pain, right  Bursitis of right elbow, unspecified bursa      NEW MEDICATIONS STARTED DURING THIS VISIT:  New Prescriptions   No medications on file     Note:  This document was prepared using Dragon voice recognition software and may include unintentional dictation errors.     Willy Eddyobinson, Youssouf Shipley, MD 10/08/18 [phone]

## 2019-12-07 ENCOUNTER — Other Ambulatory Visit: Payer: Self-pay

## 2019-12-07 ENCOUNTER — Encounter: Payer: Self-pay | Admitting: *Deleted

## 2019-12-07 DIAGNOSIS — F17210 Nicotine dependence, cigarettes, uncomplicated: Secondary | ICD-10-CM | POA: Insufficient documentation

## 2019-12-07 DIAGNOSIS — I10 Essential (primary) hypertension: Secondary | ICD-10-CM | POA: Insufficient documentation

## 2019-12-07 DIAGNOSIS — F419 Anxiety disorder, unspecified: Secondary | ICD-10-CM | POA: Insufficient documentation

## 2019-12-07 DIAGNOSIS — F1020 Alcohol dependence, uncomplicated: Secondary | ICD-10-CM | POA: Insufficient documentation

## 2019-12-07 LAB — URINALYSIS, COMPLETE (UACMP) WITH MICROSCOPIC
Bacteria, UA: NONE SEEN
Bilirubin Urine: NEGATIVE
Glucose, UA: NEGATIVE mg/dL
Hgb urine dipstick: NEGATIVE
Ketones, ur: NEGATIVE mg/dL
Leukocytes,Ua: NEGATIVE
Nitrite: NEGATIVE
Protein, ur: NEGATIVE mg/dL
Specific Gravity, Urine: 1.001 — ABNORMAL LOW (ref 1.005–1.030)
Squamous Epithelial / HPF: NONE SEEN (ref 0–5)
pH: 6 (ref 5.0–8.0)

## 2019-12-07 NOTE — ED Triage Notes (Addendum)
Pt to ED via EMS with multiple medical complaints:   Pt has a hx of HTN and reports he has noticed it has been elevated for the past couple weeks. Pt has not followed up with his PCP due to "anxiety" pt has a hx of anxiety and reports he has prescribed medication hat he takes at night but has not yet taken tonight. Pt report calling the ambulance and coming out of his house tonight was a big step for him and his anxiety.   Pt denies SOB or chest pain and denies feeling  as though he needs blood work in triage.    Urinary frequency for the past couple weeks with "dark colored urine" intermittently. Pt is an alcoholic that drinks daily. Pt has been drinking beer today. 10 beers today which pt reports is his normal amount. No signs of withdrawal at this time. Pt requesting testing for a UTI.

## 2019-12-07 NOTE — ED Triage Notes (Signed)
First Nurse: patient brought in by ems from home. Patient with complaint of blood pressure elevated over the past few weeks. Patient reported that blood pressure has been 140/100. Per ems blood pressure was 153/107.

## 2019-12-08 ENCOUNTER — Emergency Department
Admission: EM | Admit: 2019-12-08 | Discharge: 2019-12-08 | Disposition: A | Discharge status: Home / Self Care | Payer: Self-pay | Attending: Emergency Medicine | Admitting: Emergency Medicine

## 2019-12-08 DIAGNOSIS — F1020 Alcohol dependence, uncomplicated: Secondary | ICD-10-CM

## 2019-12-08 DIAGNOSIS — I10 Essential (primary) hypertension: Secondary | ICD-10-CM

## 2019-12-08 DIAGNOSIS — F419 Anxiety disorder, unspecified: Secondary | ICD-10-CM

## 2019-12-08 HISTORY — DX: Essential (primary) hypertension: I10

## 2019-12-08 LAB — COMPREHENSIVE METABOLIC PANEL
ALT: 145 U/L — ABNORMAL HIGH (ref 0–44)
AST: 136 U/L — ABNORMAL HIGH (ref 15–41)
Albumin: 4.6 g/dL (ref 3.5–5.0)
Alkaline Phosphatase: 95 U/L (ref 38–126)
Anion gap: 13 (ref 5–15)
BUN: 7 mg/dL (ref 6–20)
CO2: 24 mmol/L (ref 22–32)
Calcium: 9.1 mg/dL (ref 8.9–10.3)
Chloride: 96 mmol/L — ABNORMAL LOW (ref 98–111)
Creatinine, Ser: 0.7 mg/dL (ref 0.61–1.24)
GFR calc Af Amer: >60 mL/min (ref >60)
GFR calc non Af Amer: >60 mL/min (ref >60)
Glucose, Bld: 94 mg/dL (ref 70–99)
Potassium: 3.7 mmol/L (ref 3.5–5.1)
Sodium: 133 mmol/L — ABNORMAL LOW (ref 135–145)
Total Bilirubin: 1.8 mg/dL — ABNORMAL HIGH (ref 0.3–1.2)
Total Protein: 8.5 g/dL — ABNORMAL HIGH (ref 6.5–8.1)

## 2019-12-08 LAB — CBC WITH DIFFERENTIAL/PLATELET
Abs Immature Granulocytes: 0.03 10*3/uL (ref 0.00–0.07)
Basophils Absolute: 0.1 10*3/uL (ref 0.0–0.1)
Basophils Relative: 1 %
Eosinophils Absolute: 0.1 10*3/uL (ref 0.0–0.5)
Eosinophils Relative: 1 %
HCT: 47.7 % (ref 39.0–52.0)
Hemoglobin: 17.7 g/dL — ABNORMAL HIGH (ref 13.0–17.0)
Immature Granulocytes: 0 %
Lymphocytes Relative: 37 %
Lymphs Abs: 3.5 10*3/uL (ref 0.7–4.0)
MCH: 33.4 pg (ref 26.0–34.0)
MCHC: 37.1 g/dL — ABNORMAL HIGH (ref 30.0–36.0)
MCV: 90 fL (ref 80.0–100.0)
Monocytes Absolute: 0.8 10*3/uL (ref 0.1–1.0)
Monocytes Relative: 9 %
Neutro Abs: 4.8 10*3/uL (ref 1.7–7.7)
Neutrophils Relative %: 52 %
Platelets: 226 10*3/uL (ref 150–400)
RBC: 5.3 MIL/uL (ref 4.22–5.81)
RDW: 12.3 % (ref 11.5–15.5)
WBC: 9.3 10*3/uL (ref 4.0–10.5)
nRBC: 0 % (ref 0.0–0.2)

## 2019-12-08 LAB — TSH: TSH: 4.526 u[IU]/mL — ABNORMAL HIGH (ref 0.350–4.500)

## 2019-12-08 LAB — ETHANOL: Alcohol, Ethyl (B): 61 mg/dL — ABNORMAL HIGH (ref <10)

## 2019-12-08 LAB — T4, FREE: Free T4: 0.63 ng/dL (ref 0.61–1.12)

## 2019-12-08 LAB — HEMOGLOBIN A1C
Hgb A1c MFr Bld: 4.9 % (ref 4.8–5.6)
Mean Plasma Glucose: 93.93 mg/dL

## 2019-12-08 MED ORDER — LORAZEPAM 2 MG/ML IJ SOLN
1.0000 mg | Freq: Once | INTRAMUSCULAR | Status: AC
  Administered 2019-12-08: 1 mg via INTRAVENOUS
  Filled 2019-12-08: qty 1

## 2019-12-08 MED ORDER — THIAMINE HCL 100 MG/ML IJ SOLN
Freq: Once | INTRAVENOUS | Status: AC
  Filled 2019-12-08: qty 1000

## 2019-12-08 MED ORDER — LORAZEPAM 1 MG PO TABS
1.0000 mg | ORAL_TABLET | Freq: Three times a day (TID) | ORAL | 0 refills | Status: DC | PRN
Start: 2019-12-08 — End: 2020-02-04

## 2019-12-08 NOTE — ED Provider Notes (Signed)
[facility] Emergency Department Provider Note   ____________________________________________   First MD Initiated Contact with Patient 12/08/19 0250     (approximate)  I have reviewed the triage vital signs and the nursing notes.   HISTORY  Chief Complaint Hypertension, Anxiety, and Urinary Frequency    HPI Gilbert Alvarez is a 34 y.o. male brought to the ED via EMS from home with multiple medical complaints.  Patient has a history of hypertension, currently not taking medications.  Has noticed his blood pressure creeping up for the past several weeks.  Tonight he was upset, checked his blood pressure which was high at home.  Also has a history of anxiety and does not feel his hydroxyzine works for him.  He was anxious tonight when he was upset.  Denies SI/HI/AH/VH.  Patient also admits to drinking 10+ beers daily which is his usual amounts.  Denies history of DTs although he does note he shakes when he does not drink alcohol.  Wanted to check his urine for urinary frequency.  Denies recent fever, chills, cough, chest pain, shortness of breath, abdominal pain, nausea, vomiting, dizziness, visual changes or headache.       Past Medical History:  Diagnosis Date  . Anxiety   . Depression   . Hypertension   . Panic attacks     There are no problems to display for this patient.   History reviewed. No pertinent surgical history.  Prior to Admission medications   Medication Sig Start Date End Date Taking? Authorizing Provider  hydrOXYzine (ATARAX/VISTARIL) 25 MG tablet Take 1 tablet (25 mg total) by mouth 3 (three) times daily as needed for anxiety. 09/17/18   Minna Antis, MD  LORazepam (ATIVAN) 1 MG tablet Take 1 tablet (1 mg total) by mouth every 8 (eight) hours as needed for anxiety. 12/08/19   Irean Hong, MD    Allergies Tramadol  History reviewed. No pertinent family history.  Social History Social History   Tobacco Use  . Smoking status:  Current Every Day Smoker    Packs/day: 1.00    Types: Cigarettes  . Smokeless tobacco: Current User  Substance Use Topics  . Alcohol use: Yes    Alcohol/week: 12.0 standard drinks    Types: 12 Cans of beer per week    Comment: everynight  . Drug use: No    Review of Systems  Constitutional: No fever/chills Eyes: No visual changes. ENT: No sore throat. Cardiovascular: Denies chest pain. Respiratory: Denies shortness of breath. Gastrointestinal: No abdominal pain.  No nausea, no vomiting.  No diarrhea.  No constipation. Genitourinary: Negative for dysuria. Musculoskeletal: Negative for back pain. Skin: Negative for rash. Neurological: Negative for headaches, focal weakness or numbness. Psychiatric:  Positive for anxiety.   ____________________________________________   PHYSICAL EXAM:  VITAL SIGNS: ED Triage Vitals [12/07/19 2135]  Enc Vitals Group     BP (!) 154/107     Pulse Rate 102     Resp 16     Temp 97.9 F (36.6 C)     Temp Source Oral     SpO2 98 %     Weight      Height      Head Circumference      Peak Flow      Pain Score      Pain Loc      Pain Edu?      Excl. in GC?     Constitutional: Alert and oriented. Well appearing and in  no acute distress. Eyes: Conjunctivae are normal. PERRL. EOMI. Head: Atraumatic. Nose: No congestion/rhinnorhea. Mouth/Throat: Mucous membranes are moist.   Neck: No stridor.  No carotid bruits. Cardiovascular: Normal rate, regular rhythm. Grossly normal heart sounds.  Good peripheral circulation. Respiratory: Normal respiratory effort.  No retractions. Lungs CTAB. Gastrointestinal: Soft and nontender. No distention. No abdominal bruits. No CVA tenderness. Musculoskeletal: No lower extremity tenderness nor edema.  No joint effusions. Neurologic:  Normal speech and language. No gross focal neurologic deficits are appreciated. No gait instability. Skin:  Skin is warm, dry and intact. No rash noted. Psychiatric: Mood and  affect are mildly anxious. Speech and behavior are normal.  ____________________________________________   LABS (all labs ordered are listed, but only abnormal results are displayed)  Labs Reviewed  URINALYSIS, COMPLETE (UACMP) WITH MICROSCOPIC - Abnormal; Notable for the following components:      Result Value   Color, Urine COLORLESS (*)    APPearance CLEAR (*)    Specific Gravity, Urine 1.001 (*)    All other components within normal limits  CBC WITH DIFFERENTIAL/PLATELET - Abnormal; Notable for the following components:   Hemoglobin 17.7 (*)    MCHC 37.1 (*)    All other components within normal limits  COMPREHENSIVE METABOLIC PANEL - Abnormal; Notable for the following components:   Sodium 133 (*)    Chloride 96 (*)    Total Protein 8.5 (*)    AST 136 (*)    ALT 145 (*)    Total Bilirubin 1.8 (*)    All other components within normal limits  ETHANOL - Abnormal; Notable for the following components:   Alcohol, Ethyl (B) 61 (*)    All other components within normal limits  TSH - Abnormal; Notable for the following components:   TSH 4.526 (*)    All other components within normal limits  T4, FREE  HEMOGLOBIN A1C   ____________________________________________  EKG  ED ECG REPORT I, Brion Sossamon J, the attending physician, personally viewed and interpreted this ECG.   Date: 12/08/2019  EKG Time: 2144  Rate: 99  Rhythm: normal EKG, normal sinus rhythm  Axis: Normal  Intervals:none  ST&T Change: Nonspecific  ____________________________________________  RADIOLOGY  ED MD interpretation: None  Official radiology report(s): No results found.  ____________________________________________   PROCEDURES  Procedure(s) performed (including Critical Care):  Procedures   ____________________________________________   INITIAL IMPRESSION / ASSESSMENT AND PLAN / ED COURSE  As part of my medical decision making, I reviewed the following data within the electronic  MEDICAL RECORD NUMBER Nursing notes reviewed and incorporated, Labs reviewed, EKG interpreted, Old chart reviewed and Notes from prior ED visits     Gilbert Alvarez was evaluated in Emergency Department on 12/08/2019 for the symptoms described in the history of present illness. He was evaluated in the context of the global COVID-19 pandemic, which necessitated consideration that the patient might be at risk for infection with the SARS-CoV-2 virus that causes COVID-19. Institutional protocols and algorithms that pertain to the evaluation of patients at risk for COVID-19 are in a state of rapid change based on information released by regulatory bodies including the CDC and federal and state organizations. These policies and algorithms were followed during the patient's care in the ED.    34 year old male presenting with anxiety, elevated blood pressure, urinary frequency.  Differential diagnosis includes but is not limited to psychiatric, infectious, metabolic etiologies, etc.  UA negative for infection.  Discussed with patient and recommended lab work to evaluate electrolytes as  well as hemoglobin A1c given his complaints of urinary frequency.  Will administer banana bag, 1 mg IV Ativan and reassess.  Clinical Course as of Dec 08 622  Sat Dec 08, 2019  1751 Patient resting in no acute distress.  Feeling overall better.  Will discharge home with limited prescription for Ativan and referral to [facility] for follow-up.  Strict return precautions given.  Patient verbalizes understanding agrees with plan of care.   [JS]    Clinical Course User Index [JS] Irean Hong, MD     ____________________________________________   FINAL CLINICAL IMPRESSION(S) / ED DIAGNOSES  Final diagnoses:  Anxiety  Essential hypertension  Uncomplicated alcohol dependence [facility])     ED Discharge Orders         Ordered    LORazepam (ATIVAN) 1 MG tablet  Every 8 hours PRN     Discontinue  Reprint     12/08/19 0516            Note:  This document was prepared using Dragon voice recognition software and may include unintentional dictation errors.   Irean Hong, MD 12/08/19 [phone]

## 2019-12-08 NOTE — Discharge Instructions (Signed)
1.  You may take Ativan 1 mg every 8 hours as needed for anxiety or restlessness. 2.  Return to the ER for worsening symptoms, persistent vomiting, difficulty breathing or other concerns.

## 2019-12-26 IMAGING — CR RIGHT ELBOW - COMPLETE 3+ VIEW
1 series · 4 of 4 positions shown · non-contrast
Comparison: None.

CLINICAL DATA: Fall onto elbow. Right elbow pain and swelling.
Initial encounter.

EXAM:
RIGHT ELBOW - COMPLETE 3+ VIEW

[Series 1: dg elbow complete right (3+view) · 0.14mm/px · 4 of 4 slices shown]
[im 1/4]
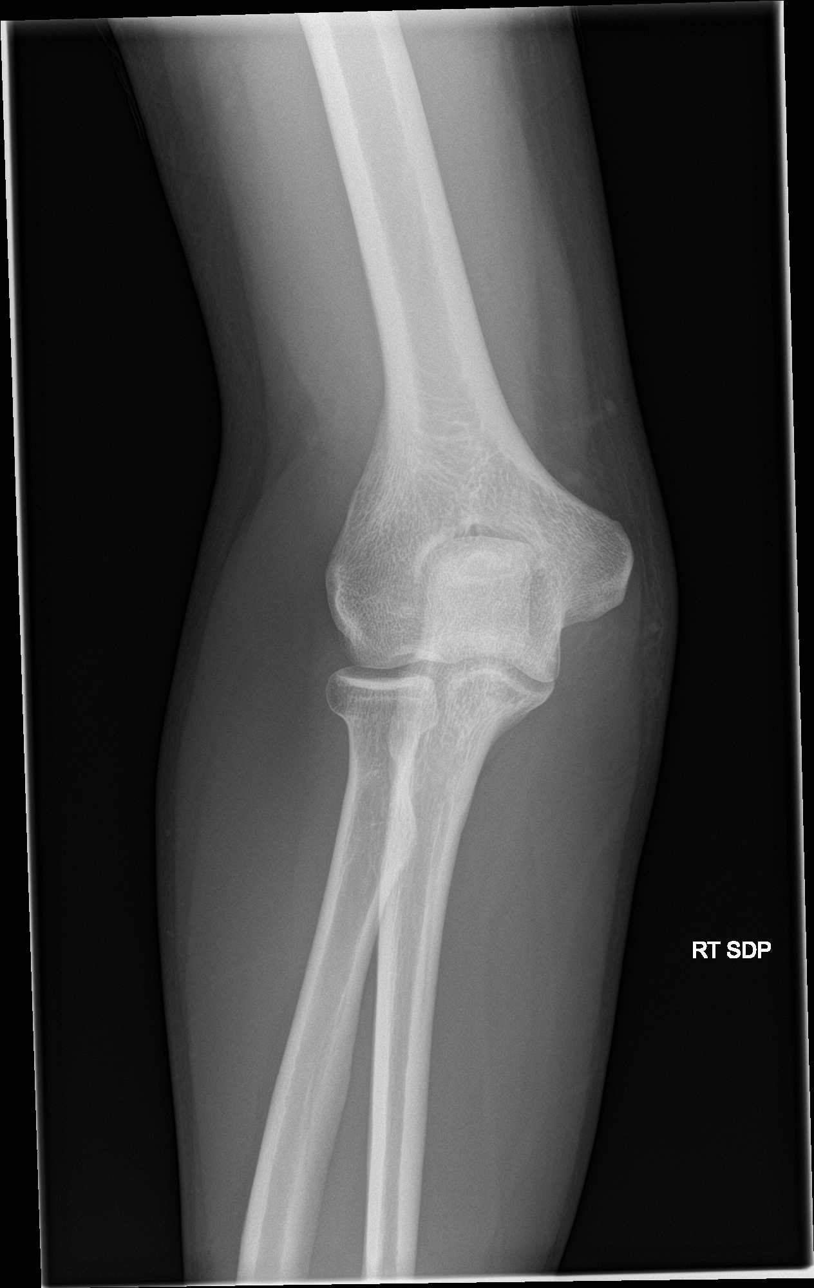
[im 2/4]
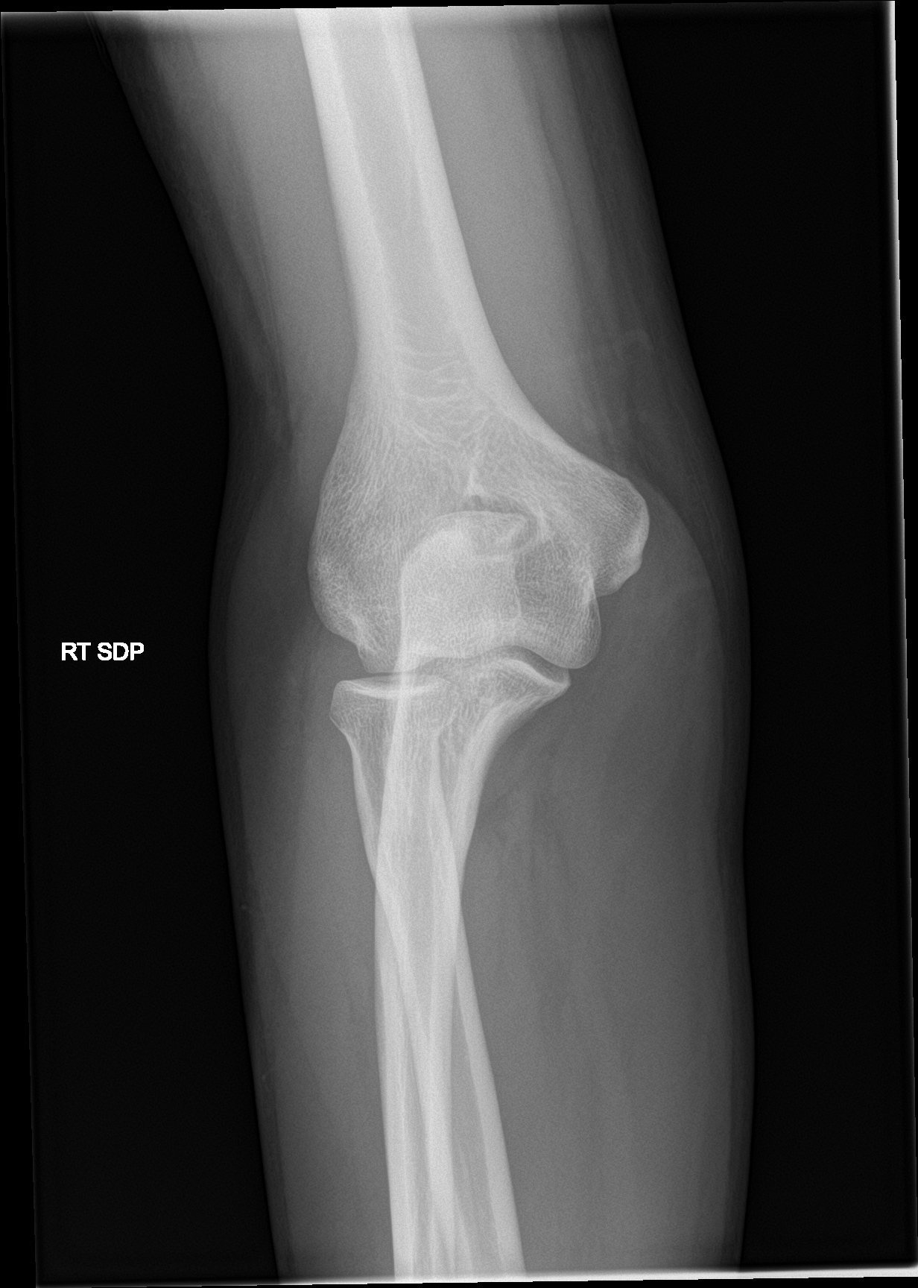
[im 3/4]
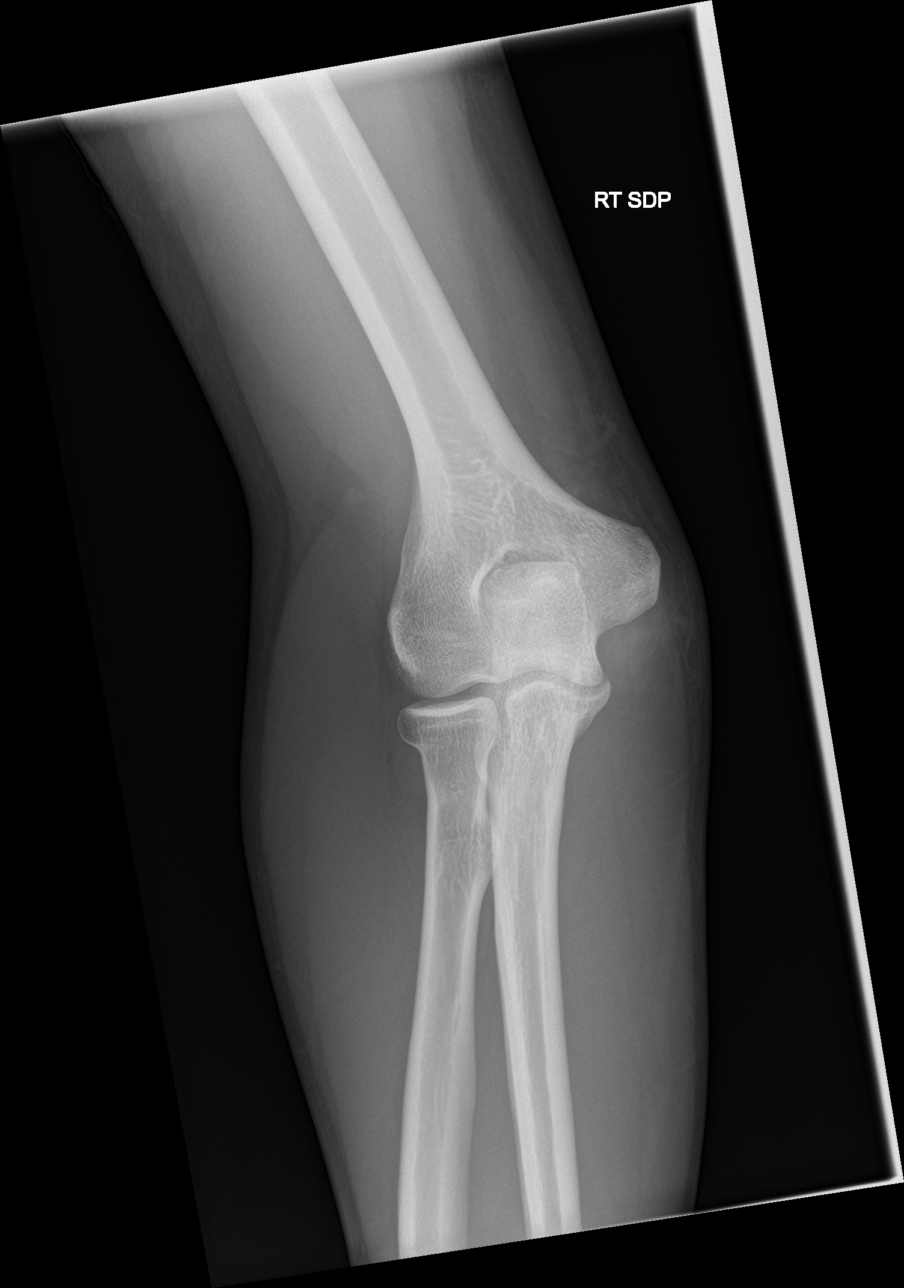
[im 4/4]
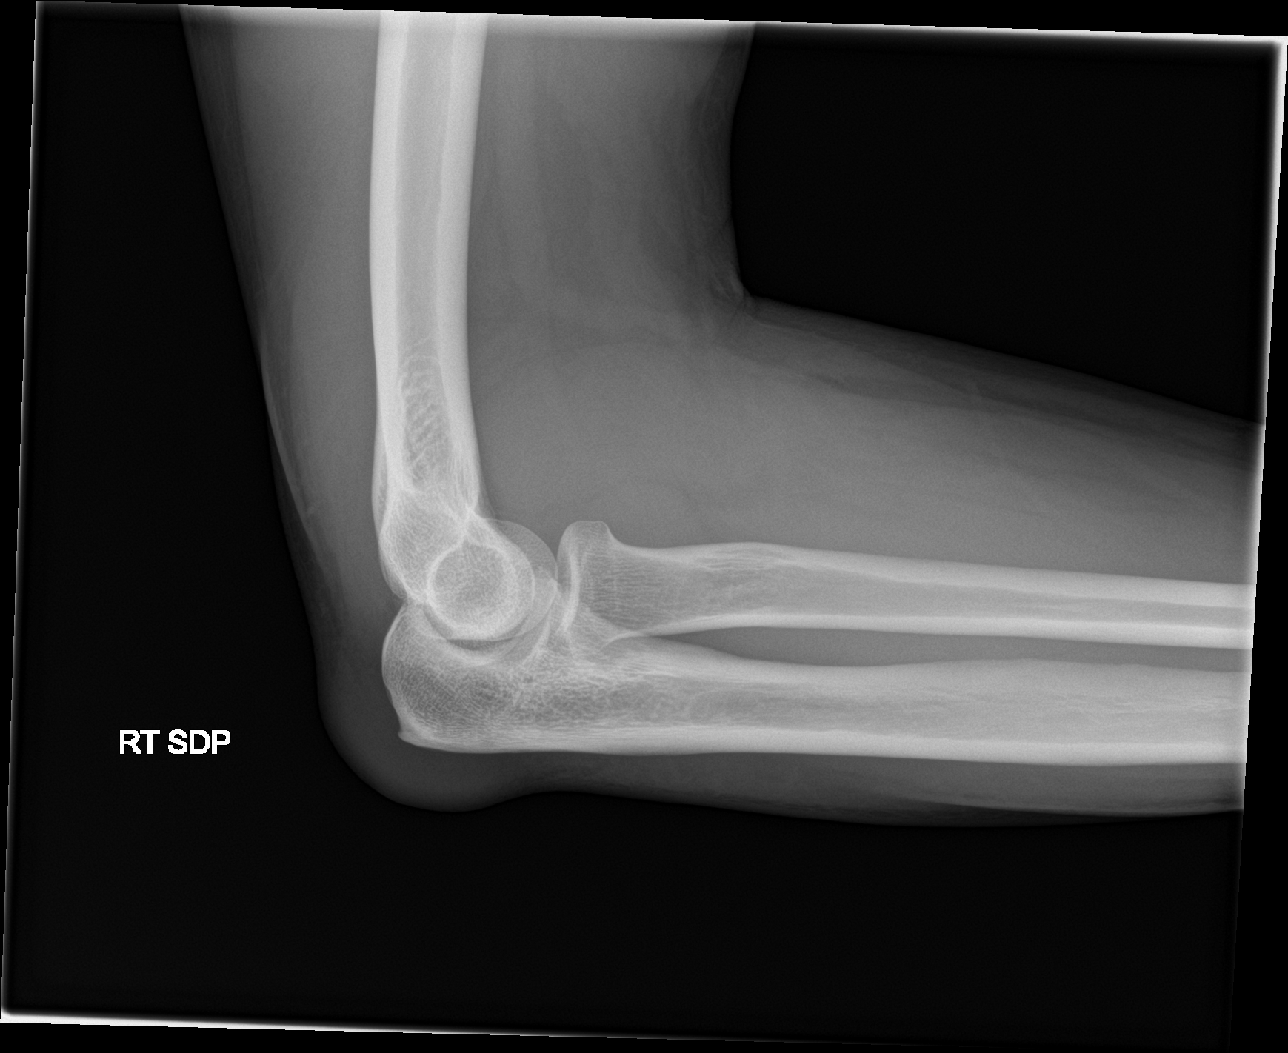

[4 of 4 positions shown; findings below may reference images not displayed]

FINDINGS: There is no evidence of fracture, dislocation, or joint effusion.
There is no evidence of arthropathy or other focal bone abnormality.
Soft tissue swelling is seen overlying the olecranon process.
IMPRESSION: Posterior soft tissue swelling. No evidence of fracture or joint
effusion.

## 2019-12-29 ENCOUNTER — Other Ambulatory Visit: Payer: Self-pay

## 2019-12-29 DIAGNOSIS — Z5321 Procedure and treatment not carried out due to patient leaving prior to being seen by health care provider: Secondary | ICD-10-CM | POA: Insufficient documentation

## 2019-12-29 DIAGNOSIS — K625 Hemorrhage of anus and rectum: Secondary | ICD-10-CM | POA: Insufficient documentation

## 2019-12-29 DIAGNOSIS — F419 Anxiety disorder, unspecified: Secondary | ICD-10-CM | POA: Insufficient documentation

## 2019-12-29 LAB — CBC
HCT: 48.5 % (ref 39.0–52.0)
Hemoglobin: 17.7 g/dL — ABNORMAL HIGH (ref 13.0–17.0)
MCH: 33.3 pg (ref 26.0–34.0)
MCHC: 36.5 g/dL — ABNORMAL HIGH (ref 30.0–36.0)
MCV: 91.2 fL (ref 80.0–100.0)
Platelets: 221 10*3/uL (ref 150–400)
RBC: 5.32 MIL/uL (ref 4.22–5.81)
RDW: 12.2 % (ref 11.5–15.5)
WBC: 10.1 10*3/uL (ref 4.0–10.5)
nRBC: 0 % (ref 0.0–0.2)

## 2019-12-29 NOTE — ED Triage Notes (Signed)
Pt states has been anxious, feels intermittent palpitations. Pt states he cannot wear a mask it is against his religion. Pt states also has been having bleeding from rectum with bowel movement but has been seen for same.

## 2019-12-29 NOTE — ED Triage Notes (Signed)
First RN note: pt states he is here for anxiety and bleeding from rectum with bowel movement. Pt refuses to wear a mask, states "god has me, it's against my religion".

## 2019-12-30 ENCOUNTER — Emergency Department
Admission: EM | Admit: 2019-12-30 | Discharge: 2019-12-30 | Disposition: A | Discharge status: Home / Self Care | Payer: Self-pay | Attending: Emergency Medicine | Admitting: Emergency Medicine

## 2019-12-30 ENCOUNTER — Emergency Department: Payer: Self-pay

## 2019-12-30 LAB — BASIC METABOLIC PANEL
Anion gap: 11 (ref 5–15)
BUN: 6 mg/dL (ref 6–20)
CO2: 29 mmol/L (ref 22–32)
Calcium: 9 mg/dL (ref 8.9–10.3)
Chloride: 98 mmol/L (ref 98–111)
Creatinine, Ser: 0.8 mg/dL (ref 0.61–1.24)
GFR calc Af Amer: >60 mL/min (ref >60)
GFR calc non Af Amer: >60 mL/min (ref >60)
Glucose, Bld: 152 mg/dL — ABNORMAL HIGH (ref 70–99)
Potassium: 3.6 mmol/L (ref 3.5–5.1)
Sodium: 138 mmol/L (ref 135–145)

## 2019-12-30 LAB — TSH: TSH: 3.079 u[IU]/mL (ref 0.350–4.500)

## 2019-12-30 LAB — T4, FREE: Free T4: 0.88 ng/dL (ref 0.61–1.12)

## 2019-12-30 LAB — TROPONIN I (HIGH SENSITIVITY): Troponin I (High Sensitivity): 4 ng/L (ref <18)

## 2020-02-04 ENCOUNTER — Other Ambulatory Visit: Payer: Self-pay

## 2020-02-04 ENCOUNTER — Encounter: Payer: Self-pay | Admitting: Family Medicine

## 2020-02-04 ENCOUNTER — Ambulatory Visit: Payer: Self-pay | Admitting: Family Medicine

## 2020-02-04 VITALS — BP 129/85 | HR 106 | Temp 98.7°F | Resp 16 | Ht 67.0 in | Wt 182.6 lb

## 2020-02-04 DIAGNOSIS — F1020 Alcohol dependence, uncomplicated: Secondary | ICD-10-CM

## 2020-02-04 DIAGNOSIS — Z720 Tobacco use: Secondary | ICD-10-CM

## 2020-02-04 DIAGNOSIS — F900 Attention-deficit hyperactivity disorder, predominantly inattentive type: Secondary | ICD-10-CM | POA: Insufficient documentation

## 2020-02-04 DIAGNOSIS — I10 Essential (primary) hypertension: Secondary | ICD-10-CM

## 2020-02-04 DIAGNOSIS — F331 Major depressive disorder, recurrent, moderate: Secondary | ICD-10-CM | POA: Insufficient documentation

## 2020-02-04 DIAGNOSIS — F411 Generalized anxiety disorder: Secondary | ICD-10-CM

## 2020-02-04 DIAGNOSIS — F410 Panic disorder [episodic paroxysmal anxiety] without agoraphobia: Secondary | ICD-10-CM | POA: Insufficient documentation

## 2020-02-04 MED ORDER — AMLODIPINE BESYLATE 5 MG PO TABS: 5.0000 mg | ORAL_TABLET | Freq: Every day | ORAL | 5 refills | Status: DC

## 2020-02-04 NOTE — Assessment & Plan Note (Signed)
Elevated home BP readings, mild elevated here but at goal No known complications    Plan:  1. NEW START Amlodipine 5mg  daily 2. Labs reviewed from ED in 12/2019 3. Encourage improved lifestyle - low sodium diet, regular exercise 4. Continue monitor BP outside office, bring readings to next visit, if persistently >140/90 or new symptoms notify office sooner Follow-up 4-6 weeks if BP not controlled on Amlodipine 5mg  dose, he can call 01/2020 leave BP readings and we can double dose to 10mg  or discuss again, otherwise if doing well can return in 6 months

## 2020-02-04 NOTE — Assessment & Plan Note (Signed)
Followed by Dr Jonni Sanger at [facility] Psychiat[facility] Currently stable on SSRI Citalopram, hydroxyzine

## 2020-02-04 NOTE — Patient Instructions (Addendum)
Thank you for coming to the office today.  Start Amlodipine 5mg  daily in morning, take one every day  Keep track of BP twice a day for now, goal readings < 140 / 90.  If readings are consistently higher than >140/90 or even one of those numbers is higher, then we should talk again in 4-6 weeks and re-check BP here.  Otherwise, if readings are good, then you can call with the average readings, and ask for refills and follow-up in 6 month.   Please schedule a Follow-up Appointment to: Return in about 6 months (around 08/03/2020) for 6 month follow-up HTN.  If you have any other questions or concerns, please feel free to call the office or send a message through MyChart. You may also schedule an earlier appointment if necessary.  Additionally, you may be receiving a survey about your experience at our office within a few days to 1 week by e-mail or mail. We value your feedback.  08/05/2020, DO [facility], [facility]

## 2020-02-04 NOTE — Progress Notes (Signed)
Subjective:    Patient ID: Gilbert Alvarez, male    DOB: Apr 21, 1986, 34 y.o.   MRN: [id number]  Gilbert Alvarez is a 34 y.o. male presenting on 02/04/2020 for Establish Care (HTN) and Anxiety  Here to establish care. No prior PCP recently  HPI   Essential Hypertension, New diagnosis Reports prior recent issue over past several months with some elevated BP readings, often readings running 140 avg / 90-100. He said in past when treated for alcohol dependence, he was given  Current Meds - None   Lifestyle: - Exercise: limited Denies CP, dyspnea, HA, edema, dizziness / lightheadedness  Anxiety with anxiety attacks ADHD Major Depression Followed by [facility] Psychiatry - Dr Merlene Pulling, apt coming up on 10/5 He is currently taking Citalopram 40mg  daily, and Hydroxyzine 25mg  PRN anxiety panic.  Tobacco Abuse  Health Maintenance: Declines Flu vaccine and COVID vaccine.  Depression screen [facility] 2/9 02/04/2020  Decreased Interest 3  Down, Depressed, Hopeless 1  PHQ - 2 Score 4  Altered sleeping 3  Tired, decreased energy 3  Change in appetite 0  Feeling bad or failure about yourself  1  Trouble concentrating 2  Moving slowly or fidgety/restless 1  Suicidal thoughts 0  PHQ-9 Score 14  Difficult doing work/chores Somewhat difficult   GAD 7 : Generalized Anxiety Score 02/04/2020  Nervous, Anxious, on Edge 3  Control/stop worrying 3  Worry too much - different things 3  Trouble relaxing 3  Restless 3  Easily annoyed or irritable 3  Afraid - awful might happen 3  Total GAD 7 Score 21  Anxiety Difficulty Very difficult      Past Medical History:  Diagnosis Date  . ADHD   . Anxiety   . Hypertension   . Panic attacks   . Sleep apnea    History reviewed. No pertinent surgical history. Social History   Socioeconomic History  . Marital status: Single    Spouse name: Not on file  . Number of children: Not on file  . Years of education: Not on file  . Highest education level: Not  on file  Occupational History  . Not on file  Tobacco Use  . Smoking status: Current Every Day Smoker    Packs/day: 0.50    Years: 17.00    Pack years: 8.50    Types: Cigarettes  . Smokeless tobacco: Never Used  Substance and Sexual Activity  . Alcohol use: Yes    Alcohol/week: 1.0 standard drink    Types: 1 Standard drinks or equivalent per week    Comment: daily  . Drug use: No  . Sexual activity: Not on file  Other Topics Concern  . Not on file  Social History Narrative  . Not on file   Social Determinants of Health   Financial Resource Strain:   . Difficulty of Paying Living Expenses: Not on file  Food Insecurity:   . Worried About 02/06/2020 in the Last Year: Not on file  . Ran Out of Food in the Last Year: Not on file  Transportation Needs:   . Lack of Transportation (Medical): Not on file  . Lack of Transportation (Non-Medical): Not on file  Physical Activity:   . Days of Exercise per Week: Not on file  . Minutes of Exercise per Session: Not on file  Stress:   . Feeling of Stress : Not on file  Social Connections:   . Frequency of Communication with Friends and Family: Not  on file  . Frequency of Social Gatherings with Friends and Family: Not on file  . Attends Religious Services: Not on file  . Active Member of Clubs or Organizations: Not on file  . Attends Banker Meetings: Not on file  . Marital Status: Not on file  Intimate Partner Violence:   . Fear of Current or Ex-Partner: Not on file  . Emotionally Abused: Not on file  . Physically Abused: Not on file  . Sexually Abused: Not on file   History reviewed. No pertinent family history. Current Outpatient Medications on File Prior to Visit  Medication Sig  . citalopram (CELEXA) 40 MG tablet Take 40 mg by mouth daily.  . hydrOXYzine (ATARAX/VISTARIL) 25 MG tablet Take 1 tablet (25 mg total) by mouth 3 (three) times daily as needed for anxiety. (Patient taking differently: Take 25 mg  by mouth 3 (three) times daily as needed for anxiety. 50 mg twice daily as needed.)   No current facility-administered medications on file prior to visit.    Review of Systems Per HPI unless specifically indicated above      Objective:    BP 129/85   Pulse (!) 106   Temp 98.7 F (37.1 C) (Temporal)   Resp 16   Ht 5\' 7"  (1.702 m)   Wt 182 lb 9.6 oz (82.8 kg)   SpO2 97%   BMI 28.60 kg/m   Wt Readings from Last 3 Encounters:  02/04/20 182 lb 9.6 oz (82.8 kg)  12/29/19 170 lb (77.1 kg)  09/16/18 160 lb (72.6 kg)    Physical Exam Vitals and nursing note reviewed.  Constitutional:      General: He is not in acute distress.    Appearance: He is well-developed. He is not diaphoretic.     Comments: Well-appearing, comfortable, cooperative  HENT:     Head: Normocephalic and atraumatic.  Eyes:     General:        Right eye: No discharge.        Left eye: No discharge.     Conjunctiva/sclera: Conjunctivae normal.  Neck:     Thyroid: No thyromegaly.  Cardiovascular:     Rate and Rhythm: Normal rate and regular rhythm.     Heart sounds: Normal heart sounds. No murmur heard.   Pulmonary:     Effort: Pulmonary effort is normal. No respiratory distress.     Breath sounds: Normal breath sounds. No wheezing or rales.  Musculoskeletal:        General: Normal range of motion.     Cervical back: Normal range of motion and neck supple.  Lymphadenopathy:     Cervical: No cervical adenopathy.  Skin:    General: Skin is warm and dry.     Findings: No erythema or rash.  Neurological:     Mental Status: He is alert and oriented to person, place, and time.  Psychiatric:        Behavior: Behavior normal.     Comments: Well groomed, good eye contact, normal speech and thoughts        Results for orders placed or performed during the hospital encounter of 12/30/19  Basic metabolic panel  Result Value Ref Range   Sodium 138 135 - 145 mmol/L   Potassium 3.6 3.5 - 5.1 mmol/L    Chloride 98 98 - 111 mmol/L   CO2 29 22 - 32 mmol/L   Glucose, Bld 152 (H) 70 - 99 mg/dL   BUN 6 6 - 20  mg/dL   Creatinine, Ser 3.66 0.61 - 1.24 mg/dL   Calcium 9.0 8.9 - 29.4 mg/dL   GFR calc non Af Amer >60 >60 mL/min   GFR calc Af Amer >60 >60 mL/min   Anion gap 11 5 - 15  CBC  Result Value Ref Range   WBC 10.1 4.0 - 10.5 K/uL   RBC 5.32 4.22 - 5.81 MIL/uL   Hemoglobin 17.7 (H) 13.0 - 17.0 g/dL   HCT 76.5 39 - 52 %   MCV 91.2 80.0 - 100.0 fL   MCH 33.3 26.0 - 34.0 pg   MCHC 36.5 (H) 30.0 - 36.0 g/dL   RDW 46.5 03.5 - 46.5 %   Platelets 221 150 - 400 K/uL   nRBC 0.0 0.0 - 0.2 %  TSH  Result Value Ref Range   TSH 3.079 0.350 - 4.500 uIU/mL  T4, free  Result Value Ref Range   Free T4 0.88 0.61 - 1.12 ng/dL  Troponin I (High Sensitivity)  Result Value Ref Range   Troponin I (High Sensitivity) 4 <18 ng/L      Assessment & Plan:   Problem List Items Addressed This Visit    Uncomplicated alcohol dependence (HCC)    Dramatic improvement by his history has reduced alcohol intake Completed prior alcohol treatment programs in past Followed by [facility] Psychiatry      Tobacco abuse   Major depressive disorder, recurrent, moderate (HCC)    Followed by Dr Jonni Sanger at [facility]ry Currently stable on SSRI Citalopram, hydroxyzine      Relevant Medications   citalopram (CELEXA) 40 MG tablet   Generalized anxiety disorder with panic attacks   Relevant Medications   citalopram (CELEXA) 40 MG tablet   Essential hypertension - Primary    Elevated home BP readings, mild elevated here but at goal No known complications    Plan:  1. NEW START Amlodipine 5mg  daily 2. Labs reviewed from ED in 12/2019 3. Encourage improved lifestyle - low sodium diet, regular exercise 4. Continue monitor BP outside office, bring readings to next visit, if persistently >140/90 or new symptoms notify office sooner Follow-up 4-6 weeks if BP not controlled on Amlodipine 5mg  dose, he can call 01/2020 leave BP  readings and we can double dose to 10mg  or discuss again, otherwise if doing well can return in 6 months       Relevant Medications   amLODipine (NORVASC) 5 MG tablet   Attention deficit hyperactivity disorder (ADHD), predominantly inattentive type         Meds ordered this encounter  Medications  . amLODipine (NORVASC) 5 MG tablet    Sig: Take 1 tablet (5 mg total) by mouth daily.    Dispense:  30 tablet    Refill:  5      Follow up plan: Return in about 6 months (around 08/03/2020) for 6 month follow-up HTN.  [place], DO [facility] 02/04/2020, 12:53 PM

## 2020-02-04 NOTE — Assessment & Plan Note (Signed)
Dramatic improvement by his history has reduced alcohol intake Completed prior alcohol treatment programs in past Followed by [facility] Psychiatry

## 2020-03-29 ENCOUNTER — Emergency Department
Admission: EM | Admit: 2020-03-29 | Discharge: 2020-03-29 | Disposition: A | Discharge status: Home / Self Care | Payer: Self-pay | Attending: Emergency Medicine | Admitting: Emergency Medicine

## 2020-03-29 ENCOUNTER — Emergency Department: Payer: Self-pay

## 2020-03-29 DIAGNOSIS — S0003XA Contusion of scalp, initial encounter: Secondary | ICD-10-CM | POA: Insufficient documentation

## 2020-03-29 DIAGNOSIS — F17210 Nicotine dependence, cigarettes, uncomplicated: Secondary | ICD-10-CM | POA: Insufficient documentation

## 2020-03-29 DIAGNOSIS — I10 Essential (primary) hypertension: Secondary | ICD-10-CM | POA: Insufficient documentation

## 2020-03-29 DIAGNOSIS — W1839XA Other fall on same level, initial encounter: Secondary | ICD-10-CM | POA: Insufficient documentation

## 2020-03-29 DIAGNOSIS — S0990XA Unspecified injury of head, initial encounter: Secondary | ICD-10-CM

## 2020-03-29 DIAGNOSIS — Z79899 Other long term (current) drug therapy: Secondary | ICD-10-CM | POA: Insufficient documentation

## 2020-03-29 MED ORDER — IBUPROFEN 600 MG PO TABS
600.0000 mg | ORAL_TABLET | Freq: Once | ORAL | Status: AC
  Administered 2020-03-29: 600 mg via ORAL
  Filled 2020-03-29: qty 1

## 2020-03-29 NOTE — ED Provider Notes (Signed)
[facility] Emergency Department Provider Note  ____________________________________________   First MD Initiated Contact with Patient 03/29/20 [phone]     (approximate)  I have reviewed the triage vital signs and the nursing notes.   HISTORY  Chief Complaint Head Injury    HPI Gilbert Alvarez is a 34 y.o. male  Here with head injury. Pt reportedly was trying to open a door in his house today that he had accidentally locked when he fell backward, striking his head. No LOC. He c/o aching, throbbing posterior headache since then. no neck pain or neck stiffness. No UE weakness or numbness. No other injuries. Pain is worse w/ palpation, head movement. No alleviating factors. Has not taken anything.        Past Medical History:  Diagnosis Date  . ADHD   . Anxiety   . Hypertension   . Panic attacks   . Sleep apnea     Patient Active Problem List   Diagnosis Date Noted  . Essential hypertension 02/04/2020  . Generalized anxiety disorder with panic attacks 02/04/2020  . Uncomplicated alcohol dependence (HCC) 02/04/2020  . Major depressive disorder, recurrent, moderate (HCC) 02/04/2020  . Attention deficit hyperactivity disorder (ADHD), predominantly inattentive type 02/04/2020  . Tobacco abuse 02/04/2020    No past surgical history on file.  Prior to Admission medications   Medication Sig Start Date End Date Taking? Authorizing Provider  amLODipine (NORVASC) 5 MG tablet Take 1 tablet (5 mg total) by mouth daily. 02/04/20   Karamalegos, Netta Neat, DO  citalopram (CELEXA) 40 MG tablet Take 40 mg by mouth daily. 01/30/20   [provider]  hydrOXYzine (ATARAX/VISTARIL) 25 MG tablet Take 1 tablet (25 mg total) by mouth 3 (three) times daily as needed for anxiety. Patient taking differently: Take 25 mg by mouth 3 (three) times daily as needed for anxiety. 50 mg twice daily as needed. 09/17/18   Minna Antis, MD    Allergies Tramadol and Zoloft  [sertraline]  No family history on file.  Social History Social History   Tobacco Use  . Smoking status: Current Every Day Smoker    Packs/day: 0.50    Years: 17.00    Pack years: 8.50    Types: Cigarettes  . Smokeless tobacco: Never Used  Substance Use Topics  . Alcohol use: Yes    Alcohol/week: 1.0 standard drink    Types: 1 Standard drinks or equivalent per week    Comment: daily  . Drug use: No    Review of Systems  Review of Systems  Constitutional: Negative for chills, fatigue and fever.  HENT: Negative for sore throat.   Respiratory: Negative for shortness of breath.   Cardiovascular: Negative for chest pain.  Gastrointestinal: Negative for abdominal pain.  Genitourinary: Negative for flank pain.  Musculoskeletal: Negative for neck pain.  Skin: Negative for rash and wound.  Allergic/Immunologic: Negative for immunocompromised state.  Neurological: Positive for headaches. Negative for weakness and numbness.  Hematological: Does not bruise/bleed easily.  All other systems reviewed and are negative.    ____________________________________________  PHYSICAL EXAM:      VITAL SIGNS: ED Triage Vitals  Enc Vitals Group     BP 03/29/20 0234 (!) 139/94     Pulse Rate 03/29/20 0234 100     Resp 03/29/20 0234 18     Temp 03/29/20 0234 98.1 F (36.7 C)     Temp Source 03/29/20 0234 Oral     SpO2 03/29/20 0234 96 %  Weight 03/29/20 0233 188 lb (85.3 kg)     Height 03/29/20 0233 5\' 7"  (1.702 m)     Head Circumference --      Peak Flow --      Pain Score 03/29/20 0516 8     Pain Loc --      Pain Edu? --      Excl. in GC? --      Physical Exam Vitals and nursing note reviewed.  Constitutional:      General: He is not in acute distress.    Appearance: He is well-developed.  HENT:     Head: Normocephalic.     Comments: Mild posterior scalp TTP. No large hematoma. No periorbital or postauricular ecchymoses. Eyes:     Conjunctiva/sclera: Conjunctivae  normal.  Cardiovascular:     Rate and Rhythm: Normal rate and regular rhythm.     Heart sounds: Normal heart sounds.  Pulmonary:     Effort: Pulmonary effort is normal. No respiratory distress.     Breath sounds: No wheezing.  Abdominal:     General: There is no distension.  Musculoskeletal:     Cervical back: Neck supple.  Skin:    General: Skin is warm.     Capillary Refill: Capillary refill takes less than 2 seconds.     Findings: No rash.  Neurological:     Mental Status: He is alert and oriented to person, place, and time.     Motor: No abnormal muscle tone.       ____________________________________________   LABS (all labs ordered are listed, but only abnormal results are displayed)  Labs Reviewed - No data to display  ____________________________________________  EKG: None ________________________________________  RADIOLOGY All imaging, including plain films, CT scans, and ultrasounds, independently reviewed by me, and interpretations confirmed via formal radiology reads.  ED MD interpretation:   CT Head: NAICA  Official radiology report(s): CT Head Wo Contrast  Result Date: 03/29/2020 CLINICAL DATA:  Status post fall. EXAM: CT HEAD WITHOUT CONTRAST TECHNIQUE: Contiguous axial images were obtained from the base of the skull through the vertex without intravenous contrast. COMPARISON:  October 27, 2017 FINDINGS: Brain: No evidence of acute infarction, hemorrhage, hydrocephalus, extra-axial collection or mass lesion/mass effect. Vascular: No hyperdense vessel or unexpected calcification. Skull: Normal. Negative for fracture or focal lesion. Sinuses/Orbits: No acute finding. Other: None. IMPRESSION: No acute intracranial pathology. Electronically Signed   By: October 29, 2017 M.D.   On: 03/29/2020 03:51    ____________________________________________  PROCEDURES   Procedure(s) performed (including Critical  Care):  Procedures  ____________________________________________  INITIAL IMPRESSION / MDM / ASSESSMENT AND PLAN / ED COURSE  As part of my medical decision making, I reviewed the following data within the electronic MEDICAL RECORD NUMBER Nursing notes reviewed and incorporated, Old chart reviewed, Notes from prior ED visits, and [place] Controlled Substance Database       *JONANTHONY NAHAR was evaluated in Emergency Department on 03/29/2020 for the symptoms described in the history of present illness. He was evaluated in the context of the global COVID-19 pandemic, which necessitated consideration that the patient might be at risk for infection with the SARS-CoV-2 virus that causes COVID-19. Institutional protocols and algorithms that pertain to the evaluation of patients at risk for COVID-19 are in a state of rapid change based on information released by regulatory bodies including the CDC and federal and state organizations. These policies and algorithms were followed during the patient's care in the ED.  Some ED evaluations  and interventions may be delayed as a result of limited staffing during the pandemic.*     Medical Decision Making:  34 yo M here with head injury, fall. No LOC. CT head shows NAICA. He has no midline neck pain or tenderness, no UE weakness, numbness, or signs of cervical or occult cord injury. Pt is not on anticoagulants. Will treat conservatively, d/c with outpt follow-up. Concussion precautions discussed.  ____________________________________________  FINAL CLINICAL IMPRESSION(S) / ED DIAGNOSES  Final diagnoses:  Injury of head, initial encounter  Contusion of scalp, initial encounter     MEDICATIONS GIVEN DURING THIS VISIT:  Medications  ibuprofen (ADVIL) tablet 600 mg (600 mg Oral Given 03/29/20 0037)     ED Discharge Orders    None       Note:  This document was prepared using Dragon voice recognition software and may include unintentional dictation errors.    Shaune Pollack, MD 03/29/20 [phone]

## 2020-03-29 NOTE — ED Triage Notes (Signed)
Patient reports he fell back and hit his head.  Patient denies loss of consciousness, reports feeling dizzy.

## 2020-07-22 ENCOUNTER — Emergency Department: Payer: Self-pay

## 2020-07-22 ENCOUNTER — Emergency Department
Admission: EM | Admit: 2020-07-22 | Discharge: 2020-07-22 | Disposition: A | Discharge status: Home / Self Care | Payer: Self-pay | Attending: Emergency Medicine | Admitting: Emergency Medicine

## 2020-07-22 ENCOUNTER — Other Ambulatory Visit: Payer: Self-pay

## 2020-07-22 DIAGNOSIS — J029 Acute pharyngitis, unspecified: Secondary | ICD-10-CM | POA: Insufficient documentation

## 2020-07-22 DIAGNOSIS — F419 Anxiety disorder, unspecified: Secondary | ICD-10-CM | POA: Insufficient documentation

## 2020-07-22 DIAGNOSIS — R5383 Other fatigue: Secondary | ICD-10-CM | POA: Insufficient documentation

## 2020-07-22 DIAGNOSIS — I10 Essential (primary) hypertension: Secondary | ICD-10-CM | POA: Insufficient documentation

## 2020-07-22 DIAGNOSIS — Z20822 Contact with and (suspected) exposure to covid-19: Secondary | ICD-10-CM | POA: Insufficient documentation

## 2020-07-22 DIAGNOSIS — R1110 Vomiting, unspecified: Secondary | ICD-10-CM | POA: Insufficient documentation

## 2020-07-22 DIAGNOSIS — Z79899 Other long term (current) drug therapy: Secondary | ICD-10-CM | POA: Insufficient documentation

## 2020-07-22 DIAGNOSIS — F17210 Nicotine dependence, cigarettes, uncomplicated: Secondary | ICD-10-CM | POA: Insufficient documentation

## 2020-07-22 LAB — RESP PANEL BY RT-PCR (FLU A&B, COVID) ARPGX2
Influenza A by PCR: NEGATIVE
Influenza B by PCR: NEGATIVE
SARS Coronavirus 2 by RT PCR: NEGATIVE

## 2020-07-22 LAB — GROUP A STREP BY PCR: Group A Strep by PCR: NOT DETECTED

## 2020-07-22 NOTE — ED Notes (Signed)
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

## 2020-07-22 NOTE — ED Triage Notes (Signed)
Pt states he was having a panic attack and vomited this morning and when he spit after he saw some blood in his sputum and was concerned.

## 2020-07-22 NOTE — Discharge Instructions (Signed)
Follow up with your regular doctor if not better in 3 days.  Return if worsening 

## 2020-07-22 NOTE — ED Provider Notes (Signed)
[facility] Emergency Department Provider Note  ____________________________________________   Event Date/Time   First MD Initiated Contact with Patient 07/22/20 1024     (approximate)  I have reviewed the triage vital signs and the nursing notes.   HISTORY  Chief Complaint Hemoptysis    HPI Gilbert Alvarez is a 35 y.o. male presents emergency department complaining of a cough and vomiting.  States had a cough over the weekend states he had to do some painting at [employer].  States he is there for detox from alcohol.  States he noted blood in a streak of mucus when he vomited.  States not sure if it is coming from the stomach, lungs.  No fever.  States does have some body aches and fatigue.  Is not vaccinated for Covid.  States his throat is very sore.    Past Medical History:  Diagnosis Date   ADHD    Anxiety    Hypertension    Panic attacks    Sleep apnea     Patient Active Problem List   Diagnosis Date Noted   Essential hypertension 02/04/2020   Generalized anxiety disorder with panic attacks 02/04/2020   Uncomplicated alcohol dependence (HCC) 02/04/2020   Major depressive disorder, recurrent, moderate (HCC) 02/04/2020   Attention deficit hyperactivity disorder (ADHD), predominantly inattentive type 02/04/2020   Tobacco abuse 02/04/2020    History reviewed. No pertinent surgical history.  Prior to Admission medications   Medication Sig Start Date End Date Taking? Authorizing Provider  amLODipine (NORVASC) 5 MG tablet Take 1 tablet (5 mg total) by mouth daily. 02/04/20   Karamalegos, Netta Neat, DO  citalopram (CELEXA) 40 MG tablet Take 40 mg by mouth daily. 01/30/20   [provider]  hydrOXYzine (ATARAX/VISTARIL) 25 MG tablet Take 1 tablet (25 mg total) by mouth 3 (three) times daily as needed for anxiety. Patient taking differently: Take 25 mg by mouth 3 (three) times daily as needed for anxiety. 50 mg twice daily as needed.  09/17/18   Minna Antis, MD    Allergies Tramadol and Zoloft [sertraline]  No family history on file.  Social History Social History   Tobacco Use   Smoking status: Current Every Day Smoker    Packs/day: 0.50    Years: 17.00    Pack years: 8.50    Types: Cigarettes   Smokeless tobacco: Never Used  Substance Use Topics   Alcohol use: Not Currently    Alcohol/week: 1.0 standard drink    Types: 1 Standard drinks or equivalent per week    Comment: daily   Drug use: No    Review of Systems  Constitutional: No fever/chills Eyes: No visual changes. ENT: Positive sore throat. Respiratory: Positive cough Cardiovascular: Denies chest pain Gastrointestinal: Denies abdominal pain Genitourinary: Negative for dysuria. Musculoskeletal: Negative for back pain. Skin: Negative for rash. Psychiatric: no mood changes,     ____________________________________________   PHYSICAL EXAM:  VITAL SIGNS: ED Triage Vitals  Enc Vitals Group     BP 07/22/20 0938 (!) 138/97     Pulse Rate 07/22/20 0938 87     Resp 07/22/20 0938 18     Temp 07/22/20 0938 (!) 97.5 F (36.4 C)     Temp Source 07/22/20 0938 Oral     SpO2 07/22/20 0938 98 %     Weight 07/22/20 0939 182 lb (82.6 kg)     Height 07/22/20 0939 5\' 7"  (1.702 m)     Head Circumference --  Peak Flow --      Pain Score 07/22/20 0939 0     Pain Loc --      Pain Edu? --      Excl. in GC? --     Constitutional: Alert and oriented. Well appearing and in no acute distress. Eyes: Conjunctivae are normal.  Head: Atraumatic. Nose: No congestion/rhinnorhea. Mouth/Throat: Mucous membranes are moist.  Throat is red and swollen posteriorly swollen Neck:  supple no lymphadenopathy noted Cardiovascular: Normal rate, regular rhythm. Heart sounds are normal Respiratory: Normal respiratory effort.  No retractions, lungs c t a  GU: deferred Musculoskeletal: FROM all extremities, warm and well perfused Neurologic:  Normal  speech and language.  Skin:  Skin is warm, dry and intact. No rash noted. Psychiatric: Mood and affect are normal. Speech and behavior are normal.  ____________________________________________   LABS (all labs ordered are listed, but only abnormal results are displayed)  Labs Reviewed  GROUP A STREP BY PCR  RESP PANEL BY RT-PCR (FLU A&B, COVID) [id number]   ____________________________________________   ____________________________________________  RADIOLOGY  Chest x-ray  ____________________________________________   PROCEDURES  Procedure(s) performed: No  Procedures    ____________________________________________   INITIAL IMPRESSION / ASSESSMENT AND PLAN / ED COURSE  Pertinent labs & imaging results that were available during my care of the patient were reviewed by me and considered in my medical decision making (see chart for details).   Patient is 35 year old male presents with blood in his mucus while vomiting.  Steady HPI.  Physical exam shows patient to appear stable.  DDx: Esophageal varices, irritation secondary to vomiting, strep throat, Covid  Strep test, Covid test, chest x-ray   Strep, Covid, and chest x-ray are all normal, chest x-ray was reviewed by me and confirmed by radiology   Explained findings to the patient.  He is to follow-up with his regular doctor if not improved in 3 days, return if worsening.  He was discharged stable condition.  Gilbert Alvarez was evaluated in Emergency Department on 07/22/2020 for the symptoms described in the history of present illness. He was evaluated in the context of the global COVID-19 pandemic, which necessitated consideration that the patient might be at risk for infection with the SARS-CoV-2 virus that causes COVID-19. Institutional protocols and algorithms that pertain to the evaluation of patients at risk for COVID-19 are in a state of rapid change based on information released by regulatory bodies including the  CDC and federal and state organizations. These policies and algorithms were followed during the patient's care in the ED.    As part of my medical decision making, I reviewed the following data within the electronic MEDICAL RECORD NUMBER Nursing notes reviewed and incorporated, Labs reviewed , Old chart reviewed, Radiograph reviewed , Notes from prior ED visits and [place] Controlled Substance Database  ____________________________________________   FINAL CLINICAL IMPRESSION(S) / ED DIAGNOSES  Final diagnoses:  Anxiety      NEW MEDICATIONS STARTED DURING THIS VISIT:  New Prescriptions   No medications on file     Note:  This document was prepared using Dragon voice recognition software and may include unintentional dictation errors.    Faythe Ghee, PA-C 07/22/20 1146    Minna Antis, MD 07/22/20 1353

## 2020-08-04 ENCOUNTER — Encounter: Payer: Self-pay | Admitting: Family Medicine

## 2020-08-04 ENCOUNTER — Ambulatory Visit (INDEPENDENT_AMBULATORY_CARE_PROVIDER_SITE_OTHER): Payer: Self-pay | Admitting: Family Medicine

## 2020-08-04 ENCOUNTER — Other Ambulatory Visit: Payer: Self-pay

## 2020-08-04 VITALS — BP 124/78 | HR 88 | Ht 67.0 in | Wt 181.8 lb

## 2020-08-04 DIAGNOSIS — F410 Panic disorder [episodic paroxysmal anxiety] without agoraphobia: Secondary | ICD-10-CM

## 2020-08-04 DIAGNOSIS — I10 Essential (primary) hypertension: Secondary | ICD-10-CM

## 2020-08-04 DIAGNOSIS — F411 Generalized anxiety disorder: Secondary | ICD-10-CM

## 2020-08-04 DIAGNOSIS — N3281 Overactive bladder: Secondary | ICD-10-CM

## 2020-08-04 DIAGNOSIS — F331 Major depressive disorder, recurrent, moderate: Secondary | ICD-10-CM

## 2020-08-04 MED ORDER — AMLODIPINE BESYLATE 5 MG PO TABS: 5.0000 mg | ORAL_TABLET | Freq: Every day | ORAL | 1 refills | Status: DC

## 2020-08-04 NOTE — Patient Instructions (Addendum)
Thank you for coming to the office today.  If you are going to [facility] next week for physical, they can send [place] a fax copy of the lab results if they draw blood.  BP is improved. I am okay with current dose. If it keeps dropping low when you lay down let us know we can adjust the dose to lower amount if needed.  Refilled Amlodipine 5mg  daily to pharmacy.   Please schedule a Follow-up Appointment to: Return in about 6 months (around 02/04/2021) for 6 month follow-up HTN med.  If you have any other questions or concerns, please feel free to call the office or send a message through MyChart. You may also schedule an earlier appointment if necessary.  Additionally, you may be receiving a survey about your experience at our office within a few days to 1 week by e-mail or mail. We value your feedback.  02/06/2021, DO [facility], [facility]

## 2020-08-04 NOTE — Progress Notes (Signed)
Subjective:    Patient ID: Gilbert Alvarez, male    DOB: Aug 29, 1985, 35 y.o.   MRN: [id number]  Gilbert Alvarez is a 35 y.o. male presenting on 08/04/2020 for Hypertension  HPI   Essential Hypertension Last visit 01/2020 - Reports prior issue w/ some elevated BP readings, often readings running 140 avg / 90-100. He was started on Amlodipine 5mg  Interval update he has had improved readings, 120-130s, occasionally he can get a rare episode of low blood pressure with laying down range 70/40 reported, but it does improve when active and standing up. Has not passed out with low BP  Current Meds - Amlodipine 5mg  daily Lifestyle: - Exercise: limited Denies CP, dyspnea, HA, edema, dizziness / lightheadedness  Anxiety with anxiety attacks ADHD Major Depression Alcohol Dependence in remission - followed by RTSA  Followed by [facility] - Dr , apt coming up on 10/5 He is currently taking Citalopram 40mg  daily   Tobacco Abuse  Depression screen [facility] 2/9 08/04/2020 02/04/2020  Decreased Interest 0 3  Down, Depressed, Hopeless 0 1  PHQ - 2 Score 0 4  Altered sleeping 1 3  Tired, decreased energy 1 3  Change in appetite 0 0  Feeling bad or failure about yourself  0 1  Trouble concentrating 1 2  Moving slowly or fidgety/restless 1 1  Suicidal thoughts 0 0  PHQ-9 Score 4 14  Difficult doing work/chores Somewhat difficult Somewhat difficult   GAD 7 : Generalized Anxiety Score 08/04/2020 02/04/2020  Nervous, Anxious, on Edge 3 3  Control/stop worrying 2 3  Worry too much - different things 3 3  Trouble relaxing 2 3  Restless 2 3  Easily annoyed or irritable 1 3  Afraid - awful might happen 2 3  Total GAD 7 Score 15 21  Anxiety Difficulty Very difficult Very difficult     Social History   Tobacco Use  . Smoking status: Current Every Day Smoker    Packs/day: 0.50    Years: 17.00    Pack years: 8.50    Types: Cigarettes  . Smokeless tobacco: Never Used  Substance Use  Topics  . Alcohol use: Not Currently    Alcohol/week: 1.0 standard drink    Types: 1 Standard drinks or equivalent per week    Comment: daily  . Drug use: No    Review of Systems Per HPI unless specifically indicated above     Objective:    BP 124/78 (BP Location: Left Arm, Cuff Size: Normal)   Pulse 88   Ht 5\' 7"  (1.702 m)   Wt 181 lb 12.8 oz (82.5 kg)   SpO2 100%   BMI 28.47 kg/m   Wt Readings from Last 3 Encounters:  08/04/20 181 lb 12.8 oz (82.5 kg)  07/22/20 182 lb (82.6 kg)  03/29/20 188 lb (85.3 kg)    Physical Exam Vitals and nursing note reviewed.  Constitutional:      General: He is not in acute distress.    Appearance: He is well-developed. He is not diaphoretic.     Comments: Well-appearing, comfortable, cooperative  HENT:     Head: Normocephalic and atraumatic.  Eyes:     General:        Right eye: No discharge.        Left eye: No discharge.     Conjunctiva/sclera: Conjunctivae normal.  Neck:     Thyroid: No thyromegaly.  Cardiovascular:     Rate and Rhythm: Normal rate and regular  rhythm.     Heart sounds: Normal heart sounds. No murmur heard.   Pulmonary:     Effort: Pulmonary effort is normal. No respiratory distress.     Breath sounds: Normal breath sounds. No wheezing or rales.  Musculoskeletal:        General: Normal range of motion.     Cervical back: Normal range of motion and neck supple.  Lymphadenopathy:     Cervical: No cervical adenopathy.  Skin:    General: Skin is warm and dry.     Findings: No erythema or rash.  Neurological:     Mental Status: He is alert and oriented to person, place, and time.  Psychiatric:        Behavior: Behavior normal.     Comments: Well groomed, good eye contact, normal speech and thoughts        Results for orders placed or performed during the hospital encounter of 07/22/20  Group A Strep by PCR ([facility] Only)   Specimen: Throat; Sterile Swab  Result Value Ref Range   Group A Strep by PCR NOT  DETECTED NOT DETECTED  Resp Panel by RT-PCR (Flu A&B, Covid) Nasopharyngeal Swab   Specimen: Nasopharyngeal Swab; Nasopharyngeal(NP) swabs in vial transport medium  Result Value Ref Range   SARS Coronavirus 2 by RT PCR NEGATIVE NEGATIVE   Influenza A by PCR NEGATIVE NEGATIVE   Influenza B by PCR NEGATIVE NEGATIVE       Assessment & Plan:   Problem List Items Addressed This Visit    Major depressive disorder, recurrent, moderate (HCC)   Generalized anxiety disorder with panic attacks   Essential hypertension - Primary   Relevant Medications   amLODipine (NORVASC) 5 MG tablet    Other Visit Diagnoses    OAB (overactive bladder)          HTN BP is overall controlled, some possible low readings when laying down, but asymptomatic. BP manually improved today Will continue Amlodipine 5mg  daily current dose, new refills added  Major Depression, recurrent GAD Followed by Psychiatry On Citalopram 40mg  daily - continue current   Meds ordered this encounter  Medications  . amLODipine (NORVASC) 5 MG tablet    Sig: Take 1 tablet (5 mg total) by mouth daily.    Dispense:  90 tablet    Refill:  1     Follow up plan: Return in about 6 months (around 02/04/2021) for 6 month follow-up HTN med.  He is scheduled to go to [facility] next week on Monday. He may have blood work done from them, they can fax copy of results / physical if needed. Also if he prefers due to lack of insurance they can establish with him for primary care if needed going forward.  02/06/2021, DO [facility] 08/04/2020, 2:14 PM

## 2020-08-11 ENCOUNTER — Other Ambulatory Visit: Payer: Self-pay

## 2020-08-11 ENCOUNTER — Emergency Department
Admission: EM | Admit: 2020-08-11 | Discharge: 2020-08-11 | Disposition: A | Discharge status: Home / Self Care | Payer: Self-pay | Attending: Emergency Medicine | Admitting: Emergency Medicine

## 2020-08-11 DIAGNOSIS — I10 Essential (primary) hypertension: Secondary | ICD-10-CM | POA: Insufficient documentation

## 2020-08-11 DIAGNOSIS — K602 Anal fissure, unspecified: Secondary | ICD-10-CM | POA: Insufficient documentation

## 2020-08-11 DIAGNOSIS — Z79899 Other long term (current) drug therapy: Secondary | ICD-10-CM | POA: Insufficient documentation

## 2020-08-11 DIAGNOSIS — F17210 Nicotine dependence, cigarettes, uncomplicated: Secondary | ICD-10-CM | POA: Insufficient documentation

## 2020-08-11 MED ORDER — LIDOCAINE HCL URETHRAL/MUCOSAL 2 % EX GEL
1.0000 "application " | Freq: Once | CUTANEOUS | Status: AC
  Administered 2020-08-11: 1 via TOPICAL
  Filled 2020-08-11: qty 5

## 2020-08-11 MED ORDER — LIDOCAINE 2 % EX GEL: 1.0000 "application " | Freq: Four times a day (QID) | CUTANEOUS | 1 refills | Status: DC

## 2020-08-11 MED ORDER — NITROGLYCERIN 0.4 % RE OINT: 1.0000 "application " | TOPICAL_OINTMENT | Freq: Two times a day (BID) | RECTAL | 1 refills | Status: DC

## 2020-08-11 NOTE — ED Triage Notes (Signed)
Pt comes with c/o possible hemorrhoids. Pt states this started few days ago. Pt states pain when taking a shower.

## 2020-08-11 NOTE — Discharge Instructions (Signed)
Follow discharge care instructions and apply medication as directed.  Follow-up with surgical clinic if no improvement in 2 to 3 weeks.

## 2020-08-11 NOTE — ED Provider Notes (Signed)
[facility] Emergency Department Provider Note   ____________________________________________   Event Date/Time   First MD Initiated Contact with Patient 08/11/20 1057     (approximate)  I have reviewed the triage vital signs and the nursing notes.   HISTORY  Chief Complaint Hemorrhoids    HPI Gilbert Alvarez is a 35 y.o. male patient presents with possible hemorrhoids.  Patient states pain started few days ago.  Patient did pain increased with taking shower.  Patient has a history of hemorrhoids but this episode is worse.       Past Medical History:  Diagnosis Date  . ADHD   . Anxiety   . Hypertension   . Panic attacks   . Sleep apnea     Patient Active Problem List   Diagnosis Date Noted  . Essential hypertension 02/04/2020  . Generalized anxiety disorder with panic attacks 02/04/2020  . Uncomplicated alcohol dependence (HCC) 02/04/2020  . Major depressive disorder, recurrent, moderate (HCC) 02/04/2020  . Attention deficit hyperactivity disorder (ADHD), predominantly inattentive type 02/04/2020  . Tobacco abuse 02/04/2020    History reviewed. No pertinent surgical history.  Prior to Admission medications   Medication Sig Start Date End Date Taking? Authorizing Provider  Lidocaine 2 % GEL Place 1 application around the anus in the morning, at noon, in the evening, and at bedtime. 08/11/20  Yes Joni Reining, PA-C  Nitroglycerin 0.4 % OINT Place 1 application rectally in the morning and at bedtime. 08/11/20  Yes Joni Reining, PA-C  amLODipine (NORVASC) 5 MG tablet Take 1 tablet (5 mg total) by mouth daily. 08/04/20   Karamalegos, Netta Neat, DO  citalopram (CELEXA) 40 MG tablet Take 40 mg by mouth daily. 01/30/20   [provider]    Allergies Tramadol and Zoloft [sertraline]  No family history on file.  Social History Social History   Tobacco Use  . Smoking status: Current Every Day Smoker    Packs/day: 0.50    Years:  17.00    Pack years: 8.50    Types: Cigarettes  . Smokeless tobacco: Never Used  Substance Use Topics  . Alcohol use: Not Currently    Alcohol/week: 1.0 standard drink    Types: 1 Standard drinks or equivalent per week    Comment: in recovery  . Drug use: No    Review of Systems Constitutional: No fever/chills Eyes: No visual changes. ENT: No sore throat. Cardiovascular: Denies chest pain. Respiratory: Denies shortness of breath. Gastrointestinal: No abdominal pain.  No nausea, no vomiting.  No diarrhea.  No constipation.  Anal pain Genitourinary: Negative for dysuria. Musculoskeletal: Negative for back pain. Skin: Negative for rash. Neurological: Negative for headaches, focal weakness or numbness. Psychiatric:  ADHD and anxiety. Endocrine:  Hypertension Allergic/Immunilogical: Tramadol and Zoloft. ____________________________________________   PHYSICAL EXAM:  VITAL SIGNS: ED Triage Vitals  Enc Vitals Group     BP 08/11/20 1018 (!) 134/96     Pulse Rate 08/11/20 1018 96     Resp 08/11/20 1018 19     Temp 08/11/20 1018 97.7 F (36.5 C)     Temp Source 08/11/20 1018 Oral     SpO2 08/11/20 1018 94 %     Weight 08/11/20 1020 181 lb (82.1 kg)     Height 08/11/20 1020 5\' 7"  (1.702 m)     Head Circumference --      Peak Flow --      Pain Score 08/11/20 1020 6     Pain  Loc --      Pain Edu? --      Excl. in [place]? --     Constitutional: Alert and oriented. Well appearing and in no acute distress. Cardiovascular: Normal rate, regular rhythm. Grossly normal heart sounds.  Good peripheral circulation. Respiratory: Normal respiratory effort.  No retractions. Lungs CTAB. Gastrointestinal: Soft and nontender. No distention. No abdominal bruits. No CVA tenderness.  Anal fissure at the 12 o'clock position. Neurologic:  Normal speech and language. No gross focal neurologic deficits are appreciated. No gait instability. Skin:  Skin is warm, dry and intact. No rash  noted. Psychiatric: Mood and affect are normal. Speech and behavior are normal.  ____________________________________________   LABS (all labs ordered are listed, but only abnormal results are displayed)  Labs Reviewed - No data to display ____________________________________________  EKG   ____________________________________________  RADIOLOGY I, Joni Reining, personally viewed and evaluated these images (plain radiographs) as part of my medical decision making, as well as reviewing the written report by the radiologist.  ED MD interpretation:    Official radiology report(s): No results found.  ____________________________________________   PROCEDURES  Procedure(s) performed (including Critical Care):  Procedures   ____________________________________________   INITIAL IMPRESSION / ASSESSMENT AND PLAN / ED COURSE  As part of my medical decision making, I reviewed the following data within the electronic MEDICAL RECORD NUMBER         Patient presents with rectal pain which he believes associated with a hemorrhoid.  Physical exam shows anal fissure at the 3 o'clock position.  Patient given discharge care instructions.  Patient given a prescription for topical lidocaine and nitroglycerin cream.  Patient advised conservative treatment for 2 to 3 weeks and if no improvement follow-up with surgical clinic.      ____________________________________________   FINAL CLINICAL IMPRESSION(S) / ED DIAGNOSES  Final diagnoses:  Anal fissure     ED Discharge Orders         Ordered    Nitroglycerin 0.4 % OINT  2 times daily        08/11/20 1141    Lidocaine 2 % GEL  4 times daily        08/11/20 1141          *Please note:  GARLON TUGGLE was evaluated in Emergency Department on 08/11/2020 for the symptoms described in the history of present illness. He was evaluated in the context of the global COVID-19 pandemic, which necessitated consideration that the patient might  be at risk for infection with the SARS-CoV-2 virus that causes COVID-19. Institutional protocols and algorithms that pertain to the evaluation of patients at risk for COVID-19 are in a state of rapid change based on information released by regulatory bodies including the CDC and federal and state organizations. These policies and algorithms were followed during the patient's care in the ED.  Some ED evaluations and interventions may be delayed as a result of limited staffing during and the pandemic.*   Note:  This document was prepared using Dragon voice recognition software and may include unintentional dictation errors.    Joni Reining, PA-C 08/11/20 1147    Chesley Noon, MD 08/12/20 [phone]

## 2020-08-13 ENCOUNTER — Ambulatory Visit: Payer: Self-pay | Admitting: Adult Health

## 2020-08-20 ENCOUNTER — Ambulatory Visit: Payer: Self-pay | Admitting: Adult Health

## 2020-08-20 ENCOUNTER — Other Ambulatory Visit: Payer: Self-pay

## 2020-09-26 ENCOUNTER — Other Ambulatory Visit: Payer: Self-pay

## 2020-09-26 DIAGNOSIS — I10 Essential (primary) hypertension: Secondary | ICD-10-CM | POA: Insufficient documentation

## 2020-09-26 DIAGNOSIS — Z79899 Other long term (current) drug therapy: Secondary | ICD-10-CM | POA: Insufficient documentation

## 2020-09-26 DIAGNOSIS — F17210 Nicotine dependence, cigarettes, uncomplicated: Secondary | ICD-10-CM | POA: Insufficient documentation

## 2020-09-26 DIAGNOSIS — K602 Anal fissure, unspecified: Secondary | ICD-10-CM | POA: Insufficient documentation

## 2020-09-26 LAB — COMPREHENSIVE METABOLIC PANEL
ALT: 37 U/L (ref 0–44)
AST: 32 U/L (ref 15–41)
Albumin: 4.8 g/dL (ref 3.5–5.0)
Alkaline Phosphatase: 115 U/L (ref 38–126)
Anion gap: 13 (ref 5–15)
BUN: 6 mg/dL (ref 6–20)
CO2: 22 mmol/L (ref 22–32)
Calcium: 9.3 mg/dL (ref 8.9–10.3)
Chloride: 102 mmol/L (ref 98–111)
Creatinine, Ser: 0.57 mg/dL — ABNORMAL LOW (ref 0.61–1.24)
GFR, Estimated: >60 mL/min (ref >60)
Glucose, Bld: 106 mg/dL — ABNORMAL HIGH (ref 70–99)
Potassium: 3.7 mmol/L (ref 3.5–5.1)
Sodium: 137 mmol/L (ref 135–145)
Total Bilirubin: 1.8 mg/dL — ABNORMAL HIGH (ref 0.3–1.2)
Total Protein: 8.8 g/dL — ABNORMAL HIGH (ref 6.5–8.1)

## 2020-09-26 LAB — CBC
HCT: 52.3 % — ABNORMAL HIGH (ref 39.0–52.0)
Hemoglobin: 18.5 g/dL — ABNORMAL HIGH (ref 13.0–17.0)
MCH: 32 pg (ref 26.0–34.0)
MCHC: 35.4 g/dL (ref 30.0–36.0)
MCV: 90.5 fL (ref 80.0–100.0)
Platelets: 303 10*3/uL (ref 150–400)
RBC: 5.78 MIL/uL (ref 4.22–5.81)
RDW: 14.2 % (ref 11.5–15.5)
WBC: 11.4 10*3/uL — ABNORMAL HIGH (ref 4.0–10.5)
nRBC: 0 % (ref 0.0–0.2)

## 2020-09-26 NOTE — ED Triage Notes (Signed)
Pt reports rectal pain and bleeding since this morning. Reports diarrhea today. Blood noted during wiping. Denies associated abdominal pain, nausea, vomiting, constipation. Hx of hemorrhoids and anal fissures.

## 2020-09-27 ENCOUNTER — Emergency Department
Admission: EM | Admit: 2020-09-27 | Discharge: 2020-09-27 | Disposition: A | Discharge status: Home / Self Care | Payer: Self-pay | Attending: Emergency Medicine | Admitting: Emergency Medicine

## 2020-09-27 DIAGNOSIS — K625 Hemorrhage of anus and rectum: Secondary | ICD-10-CM

## 2020-09-27 DIAGNOSIS — K602 Anal fissure, unspecified: Secondary | ICD-10-CM

## 2020-09-27 MED ORDER — LIDOCAINE-HYDROCORTISONE ACE 3-2.5 % RE KIT: PACK | RECTAL | 0 refills | Status: DC

## 2020-09-27 MED ORDER — DOCUSATE SODIUM 100 MG PO CAPS: 100.0000 mg | ORAL_CAPSULE | Freq: Two times a day (BID) | ORAL | 2 refills | Status: AC

## 2020-09-27 NOTE — ED Provider Notes (Signed)
[facility] Emergency Department Provider Note  Time seen: 2:07 AM  I have reviewed the triage vital signs and the nursing notes.   HISTORY  Chief Complaint Rectal Bleeding   HPI Gilbert Alvarez is a 35 y.o. male with a past medical history of ADHD, anxiety, hypertension, presents emergency department for rectal pain and bleeding.  According to the patient he has a past history of hemorrhoids and fissures.  States he is prescribed a lidocaine cream which she has with them but states he has been using it and he continues to have rectal pain and has noticed over the past several days blood on the toilet paper when he wipes after having a bowel movement.  Patient denies any abdominal pain nausea vomiting.   Past Medical History:  Diagnosis Date  . ADHD   . Anxiety   . Hypertension   . Panic attacks   . Sleep apnea     Patient Active Problem List   Diagnosis Date Noted  . Essential hypertension 02/04/2020  . Generalized anxiety disorder with panic attacks 02/04/2020  . Uncomplicated alcohol dependence ([place]) 02/04/2020  . Major depressive disorder, recurrent, moderate ([place]) 02/04/2020  . Attention deficit hyperactivity disorder (ADHD), predominantly inattentive type 02/04/2020  . Tobacco abuse 02/04/2020    No past surgical history on file.  Prior to Admission medications   Medication Sig Start Date End Date Taking? Authorizing Provider  amLODipine (NORVASC) 5 MG tablet Take 1 tablet (5 mg total) by mouth daily. 08/04/20   Karamalegos, Devonne Doughty, DO  citalopram (CELEXA) 40 MG tablet Take 40 mg by mouth daily. 01/30/20   [provider]  Lidocaine 2 % GEL Place 1 application around the anus in the morning, at noon, in the evening, and at bedtime. 08/11/20   Sable Feil, PA-C  Nitroglycerin 0.4 % OINT Place 1 application rectally in the morning and at bedtime. 08/11/20   Sable Feil, PA-C    Allergies  Allergen Reactions  . Tramadol   . Zoloft  [Sertraline] Other (See Comments)    Altered mental status    No family history on file.  Social History Social History   Tobacco Use  . Smoking status: Current Every Day Smoker    Packs/day: 0.50    Years: 17.00    Pack years: 8.50    Types: Cigarettes  . Smokeless tobacco: Never Used  Substance Use Topics  . Alcohol use: Yes    Alcohol/week: 1.0 standard drink    Types: 1 Standard drinks or equivalent per week    Comment: pt reports drinking 3-4 40oz beers daily  . Drug use: No    Review of Systems Constitutional: Negative for fever. Cardiovascular: Negative for chest pain. Respiratory: Negative for shortness of breath. Gastrointestinal: Negative for abdominal pain, vomiting and diarrhea.  Positive for intermittent rectal bleeding.  Positive for rectal pain Genitourinary: Negative for urinary compaints Musculoskeletal: Negative for musculoskeletal complaints Neurological: Negative for headache All other ROS negative  ____________________________________________   PHYSICAL EXAM:  VITAL SIGNS: ED Triage Vitals  Enc Vitals Group     BP 09/26/20 2319 (!) 140/94     Pulse Rate 09/26/20 2319 (!) 106     Resp 09/26/20 2319 18     Temp 09/26/20 2319 98.2 F (36.8 C)     Temp Source 09/26/20 2319 Oral     SpO2 09/26/20 2319 95 %     Weight 09/26/20 2316 176 lb 5.9 oz (80 kg)  Height 09/26/20 2316 _0  (1.676 m)     Head Circumference --      Peak Flow --      Pain Score 09/26/20 2316 0     Pain Loc --      Pain Edu? --      Excl. in [place]? --    Constitutional: Alert and oriented. Well appearing and in no distress. Eyes: Normal exam ENT      Head: Normocephalic and atraumatic.      Mouth/Throat: Mucous membranes are moist. Cardiovascular: Normal rate, regular rhythm.  Respiratory: Normal respiratory effort without tachypnea nor retractions. Breath sounds are clear  Gastrointestinal: Soft and nontender. No distention.  Musculoskeletal: Nontender with normal  range of motion in all extremities.  Neurologic:  Normal speech and language. No gross focal neurologic deficits  Skin:  Skin is warm, dry and intact.  Psychiatric: Mood and affect are normal.   ____________________________________________   INITIAL IMPRESSION / ASSESSMENT AND PLAN / ED COURSE  Pertinent labs & imaging results that were available during my care of the patient were reviewed by me and considered in my medical decision making (see chart for details).   Patient presents to the emergency department for rectal pain and bleeding.  History of hemorrhoids and anal fissures.  On examination patient has a benign abdominal exam but does have tenderness with rectal examination and what appears to be a possible posterior fissure.  I discussed with the patient using Colace twice daily he is not currently taking any stool softener.  I also discussed with the patient using a lidocaine/hydrocortisone combo rectal kit.  Patient agreeable to plan of care.  Lab work is reassuring.  Otherwise patient appears well.  We will discharge home.  I have referred for GI follow-up although the patient states he does not currently have insurance to do so.  Gilbert Alvarez was evaluated in Emergency Department on 09/27/2020 for the symptoms described in the history of present illness. He was evaluated in the context of the global COVID-19 pandemic, which necessitated consideration that the patient might be at risk for infection with the SARS-CoV-2 virus that causes COVID-19. Institutional protocols and algorithms that pertain to the evaluation of patients at risk for COVID-19 are in a state of rapid change based on information released by regulatory bodies including the CDC and federal and state organizations. These policies and algorithms were followed during the patient's care in the ED.  ____________________________________________   FINAL CLINICAL IMPRESSION(S) / ED DIAGNOSES  Rectal pain Anal fissure    Harvest Dark, MD 09/27/20 [phone]

## 2020-10-09 ENCOUNTER — Ambulatory Visit: Payer: Self-pay | Admitting: *Deleted

## 2020-10-09 NOTE — Telephone Encounter (Signed)
  Patient is calling to report he is concerned about his heart rate. Patient states he just was released from rehab and he has been having his heart rate monitored. It has been high recently and wants to have labs and appointment. Patient has baan scheduled for office visit/evaluation. Patient advised if he has symptoms- dizziness, chest pain, difficulty breathing - go to ED. Reason for Disposition . Problems with anxiety or stress  Answer Assessment - Initial Assessment Questions 1. DESCRIPTION: "Please describe your heart rate or heartbeat that you are having" (e.g., fast/slow, regular/irregular, skipped or extra beats, "palpitations")     100-116, up and down fast heart rate- light headed at times 2. ONSET: "When did it start?" (Minutes, hours or days)      Started a few weeks ago-present vitals- BP- 112/61 P 114 3. DURATION: "How long does it last" (e.g., seconds, minutes, hours)     Varies- goes from 97- up to 100's- but stays in that range 4. PATTERN "Does it come and go, or has it been constant since it started?"  "Does it get worse with exertion?"   "Are you feeling it now?"     Comes and goes- gets worse with exertion/anxiety 5. TAP: "Using your hand, can you tap out what you are feeling on a chair or table in front of you, so that I can hear?" (Note: not all patients can do this)       n/a 6. HEART RATE: "Can you tell me your heart rate?" "How many beats in 15 seconds?"  (Note: not all patients can do this)       112- now 7. RECURRENT SYMPTOM: "Have you ever had this before?" If Yes, ask: "When was the last time?" and "What happened that time?"     Yes- in the past- anxiety 8. CAUSE: "What do you think is causing the palpitations?"     Not sure - change in medication 9. CARDIAC HISTORY: "Do you have any history of heart disease?" (e.g., heart attack, angina, bypass surgery, angioplasty, arrhythmia)      High blood pressure 10. OTHER SYMPTOMS: "Do you have any other symptoms?" (e.g.,  dizziness, chest pain, sweating, difficulty breathing)      Lightheaded at times 11. PREGNANCY: "Is there any chance you are pregnant?" "When was your last menstrual period?"       n/a  Protocols used: HEART RATE AND HEARTBEAT QUESTIONS-A-AH

## 2020-10-10 ENCOUNTER — Ambulatory Visit (INDEPENDENT_AMBULATORY_CARE_PROVIDER_SITE_OTHER): Payer: Self-pay | Admitting: Family Medicine

## 2020-10-10 ENCOUNTER — Encounter: Payer: Self-pay | Admitting: Family Medicine

## 2020-10-10 ENCOUNTER — Other Ambulatory Visit: Payer: Self-pay

## 2020-10-10 VITALS — BP 122/78 | HR 101 | Ht 66.0 in | Wt 181.6 lb

## 2020-10-10 DIAGNOSIS — I10 Essential (primary) hypertension: Secondary | ICD-10-CM

## 2020-10-10 DIAGNOSIS — F411 Generalized anxiety disorder: Secondary | ICD-10-CM

## 2020-10-10 DIAGNOSIS — F410 Panic disorder [episodic paroxysmal anxiety] without agoraphobia: Secondary | ICD-10-CM

## 2020-10-10 DIAGNOSIS — R000 Tachycardia, unspecified: Secondary | ICD-10-CM

## 2020-10-10 MED ORDER — ATENOLOL 25 MG PO TABS: 25.0000 mg | ORAL_TABLET | Freq: Every day | ORAL | 2 refills | Status: DC

## 2020-10-10 NOTE — Patient Instructions (Addendum)
Thank you for coming to the office today.  Start Atenolol 25mg  once daily in morning every day for lowering heart rate. If too strong and feel low blood pressure or dizzy can cut tablet in HALF for half dose.  Continue Amlodipine 5mg  daily.  Please schedule a Follow-up Appointment to: Return in about 3 months (around 01/10/2021) for 3 month follow-up HTN, Heart rate, Anxiety.  If you have any other questions or concerns, please feel free to call the office or send a message through MyChart. You may also schedule an earlier appointment if necessary.  Additionally, you may be receiving a survey about your experience at our office within a few days to 1 week by e-mail or mail. We value your feedback.  , DO [facility], Saralyn Pilar

## 2020-10-10 NOTE — Assessment & Plan Note (Signed)
Improved BP control Now tachycardia w anxiety and other factors No known complications    Plan:  1. NEW START Atenolol 25mg  daily can reduce by half if needed if too low BP 2. Continue Amlodipine 5mg  daily 3. Labs reviewed from ED in 5./2022 4. Encourage improved lifestyle - low sodium diet, regular exercise 5. Continue monitor BP outside office, bring readings to next visit, if persistently >140/90 or new symptoms notify office sooner  May consider cardiology for holter monitor in future if unresolved w/ tachycardia palpitations

## 2020-10-10 NOTE — Assessment & Plan Note (Signed)
Likely contributing to his tachycardia Psych has changed him from Citalopram tapering off and onto Paxil  Will trial Atenolol low dose for tachycardia/anxiety as well

## 2020-10-10 NOTE — Progress Notes (Signed)
Subjective:    Patient ID: Gilbert Alvarez, male    DOB: 1986/01/26, 35 y.o.   MRN: [id number]  Gilbert Alvarez is a 35 y.o. male presenting on 10/10/2020 for Irregular Heart Beat   HPI   Essential Hypertension Tachycardia Last visit 01/2020 - Reportsprior issue w/ some elevated BP readings, often readings running 140 avg / 90-100. He was started on Amlodipine 5mg  with improved BP readings.  Admits occasional elevated BP and heart rate with exertion and activity. Some component with anxiety.  Current Meds -Amlodipine 5mg  daily Lifestyle: - Exercise:limited Denies CP, dyspnea, HA, edema, dizziness / lightheadedness  Anxiety with anxiety attacks ADHD Major Depression Alcohol Dependence in early remission, previously with RTSA  Followed by [facility] He is currently taking Citalopram 40mg  daily tapering down and off this, then switching to Paroxetine 10mg  in 2 days.  He has some alcohol still, he is working on detox again.  Elevated Hemoglobin. Past history Hgb 15-18 range. Last lab 09/2020   Depression screen [facility] 2/9 08/04/2020 02/04/2020  Decreased Interest 0 3  Down, Depressed, Hopeless 0 1  PHQ - 2 Score 0 4  Altered sleeping 1 3  Tired, decreased energy 1 3  Change in appetite 0 0  Feeling bad or failure about yourself  0 1  Trouble concentrating 1 2  Moving slowly or fidgety/restless 1 1  Suicidal thoughts 0 0  PHQ-9 Score 4 14  Difficult doing work/chores Somewhat difficult Somewhat difficult    Social History   Tobacco Use  . Smoking status: Current Every Day Smoker    Packs/day: 0.50    Years: 17.00    Pack years: 8.50    Types: Cigarettes  . Smokeless tobacco: Never Used  Substance Use Topics  . Alcohol use: Yes    Alcohol/week: 1.0 standard drink    Types: 1 Standard drinks or equivalent per week    Comment: pt reports drinking 3-4 40oz beers daily  . Drug use: No    Review of Systems Per HPI unless specifically indicated above      Objective:    BP 122/78   Pulse (!) 101   Ht 5\' 6"  (1.676 m)   Wt 181 lb 9.6 oz (82.4 kg)   SpO2 97%   BMI 29.31 kg/m   Wt Readings from Last 3 Encounters:  10/10/20 181 lb 9.6 oz (82.4 kg)  09/26/20 176 lb 5.9 oz (80 kg)  08/11/20 181 lb (82.1 kg)    Physical Exam Vitals and nursing note reviewed.  Constitutional:      General: He is not in acute distress.    Appearance: He is well-developed. He is not diaphoretic.     Comments: Well-appearing, comfortable, cooperative  HENT:     Head: Normocephalic and atraumatic.  Eyes:     General:        Right eye: No discharge.        Left eye: No discharge.     Conjunctiva/sclera: Conjunctivae normal.  Neck:     Thyroid: No thyromegaly.  Cardiovascular:     Rate and Rhythm: Normal rate and regular rhythm.     Heart sounds: Normal heart sounds. No murmur heard.   Pulmonary:     Effort: Pulmonary effort is normal. No respiratory distress.     Breath sounds: Normal breath sounds. No wheezing or rales.  Musculoskeletal:        General: Normal range of motion.     Cervical back: Normal range of motion  and neck supple.  Lymphadenopathy:     Cervical: No cervical adenopathy.  Skin:    General: Skin is warm and dry.     Findings: No erythema or rash.  Neurological:     Mental Status: He is alert and oriented to person, place, and time.  Psychiatric:        Behavior: Behavior normal.     Comments: Well groomed, good eye contact, normal speech and thoughts        Results for orders placed or performed during the hospital encounter of 09/27/20  Comprehensive metabolic panel  Result Value Ref Range   Sodium 137 135 - 145 mmol/L   Potassium 3.7 3.5 - 5.1 mmol/L   Chloride 102 98 - 111 mmol/L   CO2 22 22 - 32 mmol/L   Glucose, Bld 106 (H) 70 - 99 mg/dL   BUN 6 6 - 20 mg/dL   Creatinine, Ser 7.59 (L) 0.61 - 1.24 mg/dL   Calcium 9.3 8.9 - 16.3 mg/dL   Total Protein 8.8 (H) 6.5 - 8.1 g/dL   Albumin 4.8 3.5 - 5.0 g/dL   AST  32 15 - 41 U/L   ALT 37 0 - 44 U/L   Alkaline Phosphatase 115 38 - 126 U/L   Total Bilirubin 1.8 (H) 0.3 - 1.2 mg/dL   GFR, Estimated >84 >66 mL/min   Anion gap 13 5 - 15  CBC  Result Value Ref Range   WBC 11.4 (H) 4.0 - 10.5 K/uL   RBC 5.78 4.22 - 5.81 MIL/uL   Hemoglobin 18.5 (H) 13.0 - 17.0 g/dL   HCT 59.9 (H) 35.7 - 01.7 %   MCV 90.5 80.0 - 100.0 fL   MCH 32.0 26.0 - 34.0 pg   MCHC 35.4 30.0 - 36.0 g/dL   RDW 79.3 90.3 - 00.9 %   Platelets 303 150 - 400 K/uL   nRBC 0.0 0.0 - 0.2 %      Assessment & Plan:   Problem List Items Addressed This Visit    Generalized anxiety disorder with panic attacks    Likely contributing to his tachycardia Psych has changed him from Citalopram tapering off and onto Paxil  Will trial Atenolol low dose for tachycardia/anxiety as well      Relevant Medications   hydrOXYzine (VISTARIL) 25 MG capsule   PARoxetine (PAXIL) 10 MG tablet   Essential hypertension - Primary    Improved BP control Now tachycardia w anxiety and other factors No known complications    Plan:  1. NEW START Atenolol 25mg  daily can reduce by half if needed if too low BP 2. Continue Amlodipine 5mg  daily 3. Labs reviewed from ED in 5./2022 4. Encourage improved lifestyle - low sodium diet, regular exercise 5. Continue monitor BP outside office, bring readings to next visit, if persistently >140/90 or new symptoms notify office sooner  May consider cardiology for holter monitor in future if unresolved w/ tachycardia palpitations      Relevant Medications   atenolol (TENORMIN) 25 MG tablet    Other Visit Diagnoses    Tachycardia       Relevant Medications   atenolol (TENORMIN) 25 MG tablet        Meds ordered this encounter  Medications  . atenolol (TENORMIN) 25 MG tablet    Sig: Take 1 tablet (25 mg total) by mouth daily.    Dispense:  30 tablet    Refill:  2    Follow up plan: Return in about  3 months (around 01/10/2021) for 3 month follow-up HTN, Heart  rate, Anxiety.   Saralyn Pilar, DO [facility] 10/10/2020, 9:18 AM

## 2020-10-15 ENCOUNTER — Emergency Department
Admission: EM | Admit: 2020-10-15 | Discharge: 2020-10-15 | Disposition: A | Discharge status: Home / Self Care | Payer: Self-pay | Attending: Emergency Medicine | Admitting: Emergency Medicine

## 2020-10-15 ENCOUNTER — Other Ambulatory Visit: Payer: Self-pay

## 2020-10-15 ENCOUNTER — Encounter: Payer: Self-pay | Admitting: Emergency Medicine

## 2020-10-15 DIAGNOSIS — Y906 Blood alcohol level of 120-199 mg/100 ml: Secondary | ICD-10-CM | POA: Insufficient documentation

## 2020-10-15 DIAGNOSIS — F17210 Nicotine dependence, cigarettes, uncomplicated: Secondary | ICD-10-CM | POA: Insufficient documentation

## 2020-10-15 DIAGNOSIS — F1010 Alcohol abuse, uncomplicated: Secondary | ICD-10-CM

## 2020-10-15 DIAGNOSIS — F419 Anxiety disorder, unspecified: Secondary | ICD-10-CM | POA: Insufficient documentation

## 2020-10-15 DIAGNOSIS — F1099 Alcohol use, unspecified with unspecified alcohol-induced disorder: Secondary | ICD-10-CM | POA: Insufficient documentation

## 2020-10-15 DIAGNOSIS — I10 Essential (primary) hypertension: Secondary | ICD-10-CM | POA: Insufficient documentation

## 2020-10-15 DIAGNOSIS — Z79899 Other long term (current) drug therapy: Secondary | ICD-10-CM | POA: Insufficient documentation

## 2020-10-15 LAB — CBC WITH DIFFERENTIAL/PLATELET
Abs Immature Granulocytes: 0.02 10*3/uL (ref 0.00–0.07)
Basophils Absolute: 0.1 10*3/uL (ref 0.0–0.1)
Basophils Relative: 1 %
Eosinophils Absolute: 0.1 10*3/uL (ref 0.0–0.5)
Eosinophils Relative: 1 %
HCT: 52.6 % — ABNORMAL HIGH (ref 39.0–52.0)
Hemoglobin: 18.8 g/dL — ABNORMAL HIGH (ref 13.0–17.0)
Immature Granulocytes: 0 %
Lymphocytes Relative: 41 %
Lymphs Abs: 4.4 10*3/uL — ABNORMAL HIGH (ref 0.7–4.0)
MCH: 32.4 pg (ref 26.0–34.0)
MCHC: 35.7 g/dL (ref 30.0–36.0)
MCV: 90.7 fL (ref 80.0–100.0)
Monocytes Absolute: 0.7 10*3/uL (ref 0.1–1.0)
Monocytes Relative: 6 %
Neutro Abs: 5.5 10*3/uL (ref 1.7–7.7)
Neutrophils Relative %: 51 %
Platelets: 300 10*3/uL (ref 150–400)
RBC: 5.8 MIL/uL (ref 4.22–5.81)
RDW: 13.2 % (ref 11.5–15.5)
WBC: 10.7 10*3/uL — ABNORMAL HIGH (ref 4.0–10.5)
nRBC: 0 % (ref 0.0–0.2)

## 2020-10-15 LAB — COMPREHENSIVE METABOLIC PANEL
ALT: 34 U/L (ref 0–44)
AST: 34 U/L (ref 15–41)
Albumin: 4.7 g/dL (ref 3.5–5.0)
Alkaline Phosphatase: 118 U/L (ref 38–126)
Anion gap: 16 — ABNORMAL HIGH (ref 5–15)
BUN: 8 mg/dL (ref 6–20)
CO2: 22 mmol/L (ref 22–32)
Calcium: 9.3 mg/dL (ref 8.9–10.3)
Chloride: 102 mmol/L (ref 98–111)
Creatinine, Ser: 0.73 mg/dL (ref 0.61–1.24)
GFR, Estimated: >60 mL/min (ref >60)
Glucose, Bld: 120 mg/dL — ABNORMAL HIGH (ref 70–99)
Potassium: 3.5 mmol/L (ref 3.5–5.1)
Sodium: 140 mmol/L (ref 135–145)
Total Bilirubin: 1 mg/dL (ref 0.3–1.2)
Total Protein: 8.4 g/dL — ABNORMAL HIGH (ref 6.5–8.1)

## 2020-10-15 LAB — URINE DRUG SCREEN, QUALITATIVE (ARMC ONLY)
Amphetamines, Ur Screen: NOT DETECTED
Barbiturates, Ur Screen: NOT DETECTED
Benzodiazepine, Ur Scrn: POSITIVE — AB
Cannabinoid 50 Ng, Ur ~~LOC~~: NOT DETECTED
Cocaine Metabolite,Ur ~~LOC~~: NOT DETECTED
MDMA (Ecstasy)Ur Screen: NOT DETECTED
Methadone Scn, Ur: NOT DETECTED
Opiate, Ur Screen: NOT DETECTED
Phencyclidine (PCP) Ur S: NOT DETECTED
Tricyclic, Ur Screen: NOT DETECTED

## 2020-10-15 LAB — TROPONIN I (HIGH SENSITIVITY): Troponin I (High Sensitivity): 3 ng/L (ref <18)

## 2020-10-15 LAB — ACETAMINOPHEN LEVEL: Acetaminophen (Tylenol), Serum: <10 ug/mL — ABNORMAL LOW (ref 10–30)

## 2020-10-15 LAB — ETHANOL: Alcohol, Ethyl (B): 192 mg/dL — ABNORMAL HIGH (ref <10)

## 2020-10-15 LAB — SALICYLATE LEVEL: Salicylate Lvl: <7 mg/dL — ABNORMAL LOW (ref 7.0–30.0)

## 2020-10-15 MED ORDER — SODIUM CHLORIDE 0.9 % IV BOLUS
1000.0000 mL | Freq: Once | INTRAVENOUS | Status: AC
  Administered 2020-10-15: 1000 mL via INTRAVENOUS

## 2020-10-15 MED ORDER — THIAMINE HCL 100 MG/ML IJ SOLN: 100.0000 mg | Freq: Every day | INTRAMUSCULAR | Status: DC

## 2020-10-15 MED ORDER — LORAZEPAM 2 MG PO TABS
0.0000 mg | ORAL_TABLET | Freq: Four times a day (QID) | ORAL | Status: DC
  Administered 2020-10-15: 1 mg via ORAL
  Filled 2020-10-15: qty 1

## 2020-10-15 MED ORDER — THIAMINE HCL 100 MG PO TABS
100.0000 mg | ORAL_TABLET | Freq: Every day | ORAL | Status: DC
  Administered 2020-10-15: 100 mg via ORAL
  Filled 2020-10-15: qty 1

## 2020-10-15 MED ORDER — LORAZEPAM 2 MG/ML IJ SOLN: 0.0000 mg | Freq: Four times a day (QID) | INTRAMUSCULAR | Status: DC

## 2020-10-15 NOTE — BH Assessment (Addendum)
Comprehensive Clinical Assessment (CCA) Note  10/15/2020 [name] [id number]  Chief Complaint:  Chief Complaint  Patient presents with  . Anxiety   Visit Diagnosis: Anxiety and Alcohol Use Disorder   Race Gilbert Alvarez is a 35 year old male who presents to the ER due to symptoms of anxiety. Patient states he was having chest pains, feeling anxious and was unsure how to manage it. While in the ER, he voiced his desire to get help for his alcohol use. He shared, he was recently discharged from [facility], from their detox program. He shared he wanted to return there but he cannot return at this time due to recently discharging. He isn't eligible to return until a thirty window from discharge. Patient further shared if he is unable to go back to [facility], he didn't want to go anywhere else.  His symptoms of withdrawal are; shakes, some nausea and vomiting and some dizziness.  His [facility] Peer Support worker was present, during the interview. Patient agreed to him staying in the room, after writer had the peer support to leave the room and asked him if he want him to stay. During the interview he patient was calm, cooperative and pleasant. He was able to provide appropriate answers to the questions. Throughout the interview, he denied SI/HI and AV/H. Patient also reports of having no history of violence or aggression and no current involvement with the legal system.  CCA Screening, Triage and Referral (STR)  Patient Reported Information How did you hear about [place]? Self  Referral name: Self  Referral phone number: No data recorded  Whom do you see for routine medical problems? No data recorded Practice/Facility Name: No data recorded Practice/Facility Phone Number: No data recorded Name of Contact: No data recorded Contact Number: No data recorded Contact Fax Number: No data recorded Prescriber Name: No data recorded Prescriber Address (if known): No data recorded  What Is the Reason for Your Visit/Call  Today? having anxiety and chest pains, as well as alcohol use.  How Long Has This Been Causing You Problems? > than 6 months  What Do You Feel Would Help You the Most Today? Treatment for Depression or other mood problem; Alcohol or Drug Use Treatment   Have You Recently Been in Any Inpatient Treatment (Hospital/Detox/Crisis Center/28-Day Program)? Yes  Name/Location of Program/Hospital:[facility]  How Long Were You There? 6 days  When Were You Discharged? 10/12/2020   Have You Ever Received Services From [employer] Before? Yes  Who Do You See at [facility] ER visits   Have You Recently Had Any Thoughts About Hurting Yourself? No  Are You Planning to Commit Suicide/Harm Yourself At This time? No   Have you Recently Had Thoughts About Hurting Someone [name]? No  Explanation: No data recorded  Have You Used Any Alcohol or Drugs in the Past 24 Hours? Yes  How Long Ago Did You Use Drugs or Alcohol? No data recorded What Did You Use and How Much? Alcohol use, in the amount fo 2, 40oz beers   Do You Currently Have a Therapist/Psychiatrist? Yes  Name of Therapist/Psychiatrist: [facility] (Peer Support and medication managment)   Have You Been Recently Discharged From Any Public relations account executive or Programs? No  Explanation of Discharge From Practice/Program: No data recorded    CCA Screening Triage Referral Assessment Type of Contact: Face-to-Face  Is this Initial or Reassessment? No data recorded Date Telepsych consult ordered in [facility]:  No data recorded Time Telepsych consult ordered in [facility]:  No data recorded  Patient Reported Information Reviewed? Yes  Patient Left Without Being Seen? No data recorded Reason for Not Completing Assessment: No data recorded  Collateral Involvement: Peer Support [name]   Does Patient Have a Automotive engineerCourt Appointed Legal Guardian? No data recorded Name and Contact of Legal Guardian: No data recorded If Minor and Not Living with Parent(s), Who has Custody? No  data recorded Is CPS involved or ever been involved? Never  Is APS involved or ever been involved? Never   Patient Determined To Be At Risk for Harm To Self or Others Based on Review of Patient Reported Information or Presenting Complaint? No  Method: No data recorded Availability of Means: No data recorded Intent: No data recorded Notification Required: No data recorded Additional Information for Danger to Others Potential: No data recorded Additional Comments for Danger to Others Potential: No data recorded Are There Guns or Other Weapons in Your Home? No data recorded Types of Guns/Weapons: No data recorded Are These Weapons Safely Secured?                            No data recorded Who Could Verify You Are Able To Have These Secured: No data recorded Do You Have any Outstanding Charges, Pending Court Dates, Parole/Probation? No data recorded Contacted To Inform of Risk of Harm To Self or Others: No data recorded  [facility]   Does Patient Present under Involuntary Commitment? No  IVC Papers Initial File Date: No data recorded  [place]   Patient Currently Receiving the Following Services: Medication Management; Peer Support Services   Determination of Need: Emergent (2 hours)   Options For Referral: Outpatient Therapy     CCA Biopsychosocial Intake/Chief Complaint:  having anxiety and chest pains, as well as alcohol use.  Current Symptoms/Problems: Chest pain an anxiety   Patient Reported Schizophrenia/Schizoaffective Diagnosis in Past: No   Strengths: Have some insight into his problems, able to care for his self and know how to reach out for help.  Preferences: Looking for detox with [facility].  Abilities: Able to care for his self   Type of Services Patient Feels are Needed: Medication Management   Initial Clinical Notes/Concerns: None noted   Mental Health Symptoms Depression:  Fatigue; Hopelessness;  Worthlessness   Duration of Depressive symptoms: Greater than two weeks   Mania:  No data recorded  Anxiety:   Tension; Restlessness; Difficulty concentrating   Psychosis:  None   Duration of Psychotic symptoms: No data recorded  Trauma:  None   Obsessions:  None   Compulsions:  "Driven" to perform behaviors/acts   Inattention:  None   Hyperactivity/Impulsivity:  N/A   Oppositional/Defiant Behaviors:  None   Emotional Irregularity:  None   Other Mood/Personality Symptoms:  No data recorded   Mental Status Exam Appearance and self-care  Stature:  Average   Weight:  Average weight   Clothing:  Age-appropriate   Grooming:  Normal   Cosmetic use:  None   Posture/gait:  Normal   Motor activity:  -- (within normal range)   Sensorium  Attention:  Normal   Concentration:  Normal   Orientation:  X5   Recall/memory:  Normal   Affect and Mood  Affect:  Full Range   Mood:  Anxious   Relating  Eye contact:  Normal   Facial expression:  Responsive   Attitude toward examiner:  Cooperative   Thought and Language  Speech flow: Normal  Thought content:  Appropriate to Mood and Circumstances   Preoccupation:  None   Hallucinations:  None   Organization:  No data recorded  [employer] of Knowledge:  Average   Intelligence:  Average   Abstraction:  Normal   Judgement:  Normal   Reality Testing:  Adequate   Insight:  Fair   Decision Making:  Impulsive (Alcohol use disorder)   Social Functioning  Social Maturity:  Responsible   Social Judgement:  Chemical engineer"; Normal   Stress  Stressors:  Relationship; Financial; Transitions   Coping Ability:  Exhausted; Overwhelmed   Skill Deficits:  None   Supports:  Family; Friends/Service system     Religion: Religion/Spirituality Are You A Religious Person?: Yes What is Your Religious Affiliation?: Christian How Might This Affect Treatment?: None  reported  Leisure/Recreation: Leisure / Recreation Do You Have Hobbies?: No  Exercise/Diet: Exercise/Diet Do You Exercise?: No Have You Gained or Lost A Significant Amount of Weight in the Past Six Months?: No Do You Follow a Special Diet?: No Do You Have Any Trouble Sleeping?: No   CCA Employment/Education Employment/Work Situation: Employment / Work Psychologist, occupational Employment situation: Biomedical scientist job has been impacted by current illness: No  Education: Education Is Patient Currently Attending School?: No   CCA Family/Childhood History Family and Relationship History: Family history Marital status: Single Are you sexually active?: Yes What is your sexual orientation?: Heterosexual Has your sexual activity been affected by drugs, alcohol, medication, or emotional stress?: None reported Does patient have children?: No  Childhood History:  Childhood History Additional childhood history information: None reported Description of patient's relationship with caregiver when they were a child: None reported Patient's description of current relationship with people who raised him/her: None reported How were you disciplined when you got in trouble as a child/adolescent?: None reported Does patient have siblings?: No Did patient suffer any verbal/emotional/physical/sexual abuse as a child?: No Did patient suffer from severe childhood neglect?: No Has patient ever been sexually abused/assaulted/raped as an adolescent or adult?: No Was the patient ever a victim of a crime or a disaster?: No Witnessed domestic violence?: No Has patient been affected by domestic violence as an adult?: No  Child/Adolescent Assessment:     CCA Substance Use Alcohol/Drug Use: Alcohol / Drug Use Pain Medications: See PTA Prescriptions: See PTA Over the Counter: See PTA History of alcohol / drug use?: Yes Longest period of sobriety (when/how long): Two months Negative Consequences of Use:  Legal,Personal relationships,Work / School,Financial Substance #1 Name of Substance 1: Alcohol 1 - Age of First Use: Teenager 1 - Amount (size/oz): two to four 40oz beers 1 - Frequency: Daily 1 - Duration: Unable to quantify 1 - Last Use / Amount: 10/15/2020 1 - Method of Aquiring: Buying it at the store 1- Route of Use: Oral                       ASAM's:  Six Dimensions of Multidimensional Assessment  Dimension 1:  Acute Intoxication and/or Withdrawal Potential:      Dimension 2:  Biomedical Conditions and Complications:      Dimension 3:  Emotional, Behavioral, or Cognitive Conditions and Complications:     Dimension 4:  Readiness to Change:     Dimension 5:  Relapse, Continued use, or Continued Problem Potential:     Dimension 6:  Recovery/Living Environment:     ASAM Severity Score:    ASAM Recommended Level of Treatment:  Substance use Disorder (SUD)    Recommendations for Services/Supports/Treatments:    DSM5 Diagnoses: Patient Active Problem List   Diagnosis Date Noted  . Essential hypertension 02/04/2020  . Generalized anxiety disorder with panic attacks 02/04/2020  . Uncomplicated alcohol dependence (HCC) 02/04/2020  . Major depressive disorder, recurrent, moderate (HCC) 02/04/2020  . Attention deficit hyperactivity disorder (ADHD), predominantly inattentive type 02/04/2020  . Tobacco abuse 02/04/2020    Patient Centered Plan: Patient is on the following Treatment Plan(s):  Substance Abuse   Referrals to Alternative Service(s): Referred to Alternative Service(s):   Place:   Date:   Time:    Referred to Alternative Service(s):   Place:   Date:   Time:    Referred to Alternative Service(s):   Place:   Date:   Time:    Referred to Alternative Service(s):   Place:   Date:   Time:     Lilyan Gilford MS, LCAS, [facility], [facility] Therapeutic Triage Specialist 10/15/2020 1:22 PM

## 2020-10-15 NOTE — ED Provider Notes (Signed)
Patient seen and evaluated by [facility].  Patient only wanting to go to [facility], but they were unable to accept him back as he was just discharged a couple days ago.  Patient has tolerated p.o. intake, has been sleeping and has had no complaints throughout his stay today.  Continues to deny suicidality.  No barriers to outpatient management.  Will discharge with return precautions.   Delton Prairie, MD 10/15/20 [id number]

## 2020-10-15 NOTE — ED Triage Notes (Signed)
Patient ambulatory to triage with steady gait, without difficulty or distress noted; pt reports anxiety, recently changed celexa to paxil without relief; c/o heart racing, +ETOH

## 2020-10-15 NOTE — BH Assessment (Signed)
[facility] ([phone]) returned writers phone call. He is unable to return until the 30 day after he's been discharge from their facility. Patient was recently discharge from them, less than five days ago. Writer updated the patient, his nurse Shanda Bumps) and ER MD (Dr. Katrinka Blazing).

## 2020-10-15 NOTE — ED Provider Notes (Signed)
 [facility] Emergency Department Provider Note   ____________________________________________   Event Date/Time   First MD Initiated Contact with Patient 10/15/20 0548     (approximate)  I have reviewed the triage vital signs and the nursing notes.   HISTORY  Chief Complaint Anxiety    HPI Gilbert Alvarez is a 34 y.o. male who presents to the ED from home with a chief complaint of anxiety.  Patient reports recent change in medications from Celexa to Paxil without relief of symptoms.  Complains of heart racing.  + EtOH.  Would like help with alcohol detox.  Denies active SI/HI/AH/VH.  Denies fever, cough, chest pain, shortness of breath, abdominal pain, nausea or vomiting.     Past Medical History:  Diagnosis Date  . ADHD   . Anxiety   . Hypertension   . Panic attacks   . Sleep apnea     Patient Active Problem List   Diagnosis Date Noted  . Essential hypertension 02/04/2020  . Generalized anxiety disorder with panic attacks 02/04/2020  . Uncomplicated alcohol dependence (HCC) 02/04/2020  . Major depressive disorder, recurrent, moderate (HCC) 02/04/2020  . Attention deficit hyperactivity disorder (ADHD), predominantly inattentive type 02/04/2020  . Tobacco abuse 02/04/2020    History reviewed. No pertinent surgical history.  Prior to Admission medications   Medication Sig Start Date End Date Taking? Authorizing Provider  amLODipine (NORVASC) 5 MG tablet Take 1 tablet (5 mg total) by mouth daily. 08/04/20   Karamalegos, Alexander J, DO  atenolol (TENORMIN) 25 MG tablet Take 1 tablet (25 mg total) by mouth daily. 10/10/20   Karamalegos, Alexander J, DO  docusate sodium (COLACE) 100 MG capsule Take 1 capsule (100 mg total) by mouth 2 (two) times daily. 09/27/20 09/27/21  Paduchowski, Kevin, MD  hydrOXYzine (VISTARIL) 25 MG capsule Take 25 mg by mouth 3 (three) times daily. 09/25/20   [provider]  Lidocaine 2 % GEL Place 1 application around  the anus in the morning, at noon, in the evening, and at bedtime. 08/11/20   Smith, Ronald K, PA-C  Lidocaine-Hydrocortisone Ace 3-2.5 % KIT Please apply to rectum twice daily x 10 Days 09/27/20   Paduchowski, Kevin, MD  Nitroglycerin 0.4 % OINT Place 1 application rectally in the morning and at bedtime. 08/11/20   Smith, Ronald K, PA-C  PARoxetine (PAXIL) 10 MG tablet Take 10 mg by mouth daily. 09/23/20   [provider]    Allergies Tramadol and Zoloft [sertraline]  No family history on file.  Social History Social History   Tobacco Use  . Smoking status: Current Every Day Smoker    Packs/day: 0.50    Years: 17.00    Pack years: 8.50    Types: Cigarettes  . Smokeless tobacco: Never Used  Substance Use Topics  . Alcohol use: Yes    Alcohol/week: 1.0 standard drink    Types: 1 Standard drinks or equivalent per week    Comment: pt reports drinking 3-4 40oz beers daily  . Drug use: No    Review of Systems  Constitutional: No fever/chills Eyes: No visual changes. ENT: No sore throat. Cardiovascular: Positive for palpitations.  Denies chest pain. Respiratory: Denies shortness of breath. Gastrointestinal: No abdominal pain.  No nausea, no vomiting.  No diarrhea.  No constipation. Genitourinary: Negative for dysuria. Musculoskeletal: Negative for back pain. Skin: Negative for rash. Neurological: Negative for headaches, focal weakness or numbness. Psychiatric:  Positive for anxiety and alcohol intoxication desiring detox.  ____________________________________________     PHYSICAL EXAM:  VITAL SIGNS: ED Triage Vitals  Enc Vitals Group     BP 10/15/20 0546 (!) 164/122     Pulse Rate 10/15/20 0546 (!) 124     Resp 10/15/20 0546 20     Temp 10/15/20 0546 98.1 F (36.7 C)     Temp Source 10/15/20 0546 Oral     SpO2 10/15/20 0546 95 %     Weight 10/15/20 0539 181 lb (82.1 kg)     Height 10/15/20 0539 5' 7" (1.702 m)     Head Circumference --      Peak Flow --       Pain Score 10/15/20 0539 0     Pain Loc --      Pain Edu? --      Excl. in GC? --     Constitutional: Alert and oriented.  Intoxicated appearing and in mild acute distress. Eyes: Conjunctivae are normal. PERRL. EOMI. Head: Atraumatic. Nose: No congestion/rhinnorhea. Mouth/Throat: Mucous membranes are moist.   Neck: No stridor.  No thyromegaly. Cardiovascular: Tachycardic rate, regular rhythm. Grossly normal heart sounds.  Good peripheral circulation. Respiratory: Normal respiratory effort.  No retractions. Lungs CTAB. Gastrointestinal: Soft and nontender to light or deep palpation. No distention. No abdominal bruits. No CVA tenderness. Musculoskeletal: No lower extremity tenderness nor edema.  No joint effusions. Neurologic:  Normal speech and language. No gross focal neurologic deficits are appreciated. No gait instability. Skin:  Skin is warm, dry and intact. No rash noted. Psychiatric: Mood and affect are anxious. Speech and behavior are normal.  ____________________________________________   LABS (all labs ordered are listed, but only abnormal results are displayed)  Labs Reviewed  CBC WITH DIFFERENTIAL/PLATELET - Abnormal; Notable for the following components:      Result Value   WBC 10.7 (*)    Hemoglobin 18.8 (*)    HCT 52.6 (*)    Lymphs Abs 4.4 (*)    All other components within normal limits  COMPREHENSIVE METABOLIC PANEL - Abnormal; Notable for the following components:   Glucose, Bld 120 (*)    Total Protein 8.4 (*)    Anion gap 16 (*)    All other components within normal limits  URINE DRUG SCREEN, QUALITATIVE ([facility] ONLY) - Abnormal; Notable for the following components:   Benzodiazepine, Ur Scrn POSITIVE (*)    All other components within normal limits  ACETAMINOPHEN LEVEL  ETHANOL  SALICYLATE LEVEL  TROPONIN I (HIGH SENSITIVITY)   ____________________________________________  EKG  ED ECG REPORT I, SUNG,JADE J, the attending physician, personally  viewed and interpreted this ECG.   Date: 10/15/2020  EKG Time: 0630  Rate: 95  Rhythm: normal EKG, normal sinus rhythm  Axis: Normal  Intervals:none  ST&T Change: Nonspecific  ____________________________________________  RADIOLOGY I, SUNG,JADE J, personally viewed and evaluated these images (plain radiographs) as part of my medical decision making, as well as reviewing the written report by the radiologist.  ED MD interpretation: None  Official radiology report(s): No results found.  ____________________________________________   PROCEDURES  Procedure(s) performed (including Critical Care):  .1-3 Lead EKG Interpretation Performed by: Sung, Jade J, MD Authorized by: Sung, Jade J, MD     Interpretation: abnormal     ECG rate:  110   ECG rate assessment: tachycardic     Rhythm: sinus tachycardia     Ectopy: none     Conduction: normal   Comments:     Patient placed on cardiac monitor to evaluate for arrhythmias       ____________________________________________   INITIAL IMPRESSION / ASSESSMENT AND PLAN / ED COURSE  As part of my medical decision making, I reviewed the following data within the [place] notes reviewed and incorporated, Labs reviewed, EKG interpreted, Old chart reviewed, A consult was requested and obtained from this/these consultant(s) Psychiatry and Notes from prior ED visits     35 year old male with anxiety presenting with intoxication complaining of anxiety, palpitations and desiring detox.  Will obtain lab work, placed on CIWA scale, consult TTS to evaluate.      ____________________________________________   FINAL CLINICAL IMPRESSION(S) / ED DIAGNOSES  Final diagnoses:  Anxiety  Alcohol abuse     ED Discharge Orders    None       Note:  This document was prepared using Dragon voice recognition software and may include unintentional dictation errors.   Paulette Blanch, MD 10/15/20 [phone]

## 2020-10-15 NOTE — ED Notes (Signed)
Pt informed that he was being d/c. Calling ride now.

## 2020-10-15 NOTE — ED Notes (Signed)
Report given to [place], rn. Pt aware of move to quad.

## 2020-10-15 NOTE — ED Notes (Signed)
Pt resting in bed with no complaints at this time. Call light in reach, bed locked and low.  

## 2020-10-15 NOTE — ED Notes (Signed)
Breakfast tray given. No other needs found at this moment.  

## 2020-10-15 NOTE — ED Notes (Signed)
Pt moved to 19H. Sleeping at this time.

## 2020-10-15 NOTE — ED Notes (Signed)
Resting quietly with lights off. NAD, no complaints. Bed locked and low.

## 2020-10-15 NOTE — ED Notes (Signed)
Nad. Pt sponsor at bedside. Call light in reach.

## 2020-10-17 ENCOUNTER — Emergency Department
Admission: EM | Admit: 2020-10-17 | Discharge: 2020-10-18 | Disposition: A | Discharge status: Home / Self Care | Payer: Self-pay | Attending: Emergency Medicine | Admitting: Emergency Medicine

## 2020-10-17 ENCOUNTER — Other Ambulatory Visit: Payer: Self-pay

## 2020-10-17 DIAGNOSIS — F10129 Alcohol abuse with intoxication, unspecified: Secondary | ICD-10-CM | POA: Insufficient documentation

## 2020-10-17 DIAGNOSIS — Z5321 Procedure and treatment not carried out due to patient leaving prior to being seen by health care provider: Secondary | ICD-10-CM | POA: Insufficient documentation

## 2020-10-17 NOTE — ED Notes (Signed)
Pt ambulatory around lobby in no acute distress.  

## 2020-10-17 NOTE — ED Notes (Signed)
Pt states he is leaving °

## 2020-10-17 NOTE — ED Triage Notes (Signed)
Pt presents to ER via ems for alcohol intoxication.  Pt reports drinking from around 9 am yesterday to about 7pm today with a few breaks ion between.  Pt states he is just here to get "checked out," because he has not been taking his meds since he has been drinking.

## 2020-10-21 DIAGNOSIS — K602 Anal fissure, unspecified: Secondary | ICD-10-CM | POA: Insufficient documentation

## 2020-10-21 DIAGNOSIS — Z5321 Procedure and treatment not carried out due to patient leaving prior to being seen by health care provider: Secondary | ICD-10-CM | POA: Insufficient documentation

## 2020-10-22 ENCOUNTER — Other Ambulatory Visit: Payer: Self-pay

## 2020-10-22 ENCOUNTER — Emergency Department
Admission: EM | Admit: 2020-10-22 | Discharge: 2020-10-22 | Disposition: A | Discharge status: Home / Self Care | Payer: Self-pay | Attending: Emergency Medicine | Admitting: Emergency Medicine

## 2020-10-22 ENCOUNTER — Encounter: Payer: Self-pay | Admitting: *Deleted

## 2020-10-22 LAB — BASIC METABOLIC PANEL
Anion gap: 10 (ref 5–15)
BUN: 9 mg/dL (ref 6–20)
CO2: 25 mmol/L (ref 22–32)
Calcium: 9.2 mg/dL (ref 8.9–10.3)
Chloride: 104 mmol/L (ref 98–111)
Creatinine, Ser: 0.68 mg/dL (ref 0.61–1.24)
GFR, Estimated: >60 mL/min (ref >60)
Glucose, Bld: 109 mg/dL — ABNORMAL HIGH (ref 70–99)
Potassium: 3.6 mmol/L (ref 3.5–5.1)
Sodium: 139 mmol/L (ref 135–145)

## 2020-10-22 LAB — CBC
HCT: 52 % (ref 39.0–52.0)
Hemoglobin: 18.8 g/dL — ABNORMAL HIGH (ref 13.0–17.0)
MCH: 32.9 pg (ref 26.0–34.0)
MCHC: 36.2 g/dL — ABNORMAL HIGH (ref 30.0–36.0)
MCV: 91.1 fL (ref 80.0–100.0)
Platelets: 310 10*3/uL (ref 150–400)
RBC: 5.71 MIL/uL (ref 4.22–5.81)
RDW: 13 % (ref 11.5–15.5)
WBC: 8.2 10*3/uL (ref 4.0–10.5)
nRBC: 0 % (ref 0.0–0.2)

## 2020-10-22 NOTE — ED Triage Notes (Signed)
Pt reports anal fissure. Pt has pain and reports bleeding  pt states pain worse today.  Pt alert  speech clear.

## 2020-10-31 ENCOUNTER — Other Ambulatory Visit: Payer: Self-pay

## 2020-10-31 DIAGNOSIS — K602 Anal fissure, unspecified: Secondary | ICD-10-CM | POA: Insufficient documentation

## 2020-10-31 DIAGNOSIS — I10 Essential (primary) hypertension: Secondary | ICD-10-CM | POA: Insufficient documentation

## 2020-10-31 DIAGNOSIS — F17210 Nicotine dependence, cigarettes, uncomplicated: Secondary | ICD-10-CM | POA: Insufficient documentation

## 2020-10-31 DIAGNOSIS — Z79899 Other long term (current) drug therapy: Secondary | ICD-10-CM | POA: Insufficient documentation

## 2020-10-31 NOTE — ED Triage Notes (Signed)
Pt states has a rectal fissure that is causing him rectal pain. Pt denies known fever. States rectal fissure has been present "for some time".

## 2020-11-01 ENCOUNTER — Emergency Department
Admission: EM | Admit: 2020-11-01 | Discharge: 2020-11-01 | Disposition: A | Discharge status: Home / Self Care | Payer: Self-pay | Attending: Emergency Medicine | Admitting: Emergency Medicine

## 2020-11-01 DIAGNOSIS — K602 Anal fissure, unspecified: Secondary | ICD-10-CM

## 2020-11-01 MED ORDER — NITROGLYCERIN 0.4 % RE OINT: 1.0000 "application " | TOPICAL_OINTMENT | Freq: Two times a day (BID) | RECTAL | 1 refills | Status: AC

## 2020-11-01 MED ORDER — LIDOCAINE 2 % EX GEL: 1.0000 "application " | Freq: Four times a day (QID) | CUTANEOUS | 1 refills | Status: DC

## 2020-11-01 NOTE — ED Provider Notes (Signed)
[facility] Emergency Department Provider Note  ____________________________________________   Event Date/Time   First MD Initiated Contact with Patient 11/01/20 0217     (approximate)  I have reviewed the triage vital signs and the nursing notes.   HISTORY  Chief Complaint Rectal Pain    HPI Gilbert Alvarez is a 35 y.o. male who presents with rectal pain.  It is gradually been worsening over time.  He has a known anal fissure and said the medicines he was prescribed last time helped a lot, but he ran out and now it is hurting again.  He has soft stools, not constipation.  He engages in no penetrative anal sexual activity.  He said this has been going on for a long time.  It does not seem to be worse but the pain can be severe at times.  Nothing in particular makes it better.  No recent fever, nausea, vomiting, nor abdominal pain.     Past Medical History:  Diagnosis Date   ADHD    Anxiety    Hypertension    Panic attacks    Sleep apnea     Patient Active Problem List   Diagnosis Date Noted   Essential hypertension 02/04/2020   Generalized anxiety disorder with panic attacks 02/04/2020   Uncomplicated alcohol dependence (HCC) 02/04/2020   Major depressive disorder, recurrent, moderate (HCC) 02/04/2020   Attention deficit hyperactivity disorder (ADHD), predominantly inattentive type 02/04/2020   Tobacco abuse 02/04/2020    No past surgical history on file.  Prior to Admission medications   Medication Sig Start Date End Date Taking? Authorizing Provider  amLODipine (NORVASC) 5 MG tablet Take 1 tablet (5 mg total) by mouth daily. 08/04/20   Karamalegos, Netta Neat, DO  atenolol (TENORMIN) 25 MG tablet Take 1 tablet (25 mg total) by mouth daily. 10/10/20   Karamalegos, Netta Neat, DO  docusate sodium (COLACE) 100 MG capsule Take 1 capsule (100 mg total) by mouth 2 (two) times daily. 09/27/20 09/27/21  Minna Antis, MD  hydrOXYzine (VISTARIL) 25  MG capsule Take 25 mg by mouth 3 (three) times daily. 09/25/20   [provider]  Lidocaine 2 % GEL Place 1 application around the anus in the morning, at noon, in the evening, and at bedtime. 11/01/20   Loleta Rose, MD  Nitroglycerin 0.4 % OINT Place 1 application rectally in the morning and at bedtime. 11/01/20   Loleta Rose, MD  PARoxetine (PAXIL) 10 MG tablet Take 10 mg by mouth daily. 09/23/20   [provider]    Allergies Tramadol and Zoloft [sertraline]  No family history on file.  Social History Social History   Tobacco Use   Smoking status: Every Day    Packs/day: 0.50    Years: 17.00    Pack years: 8.50    Types: Cigarettes   Smokeless tobacco: Never  Substance Use Topics   Alcohol use: Yes    Alcohol/week: 1.0 standard drink    Types: 1 Standard drinks or equivalent per week    Comment: pt reports drinking 3-4 40oz beers daily   Drug use: No    Review of Systems Constitutional: No fever/chills Cardiovascular: Denies chest pain. Respiratory: Denies shortness of breath. Gastrointestinal: Positive for rectal pain.  No abdominal pain.  No nausea, no vomiting.  No diarrhea.  No constipation. Genitourinary: Negative for dysuria. Neurological: Negative for headaches, focal weakness or numbness.   ____________________________________________   PHYSICAL EXAM:  VITAL SIGNS: ED Triage Vitals  Enc  Vitals Group     BP 10/31/20 2157 (!) 133/105     Pulse Rate 10/31/20 2157 84     Resp 10/31/20 2157 20     Temp 10/31/20 2157 (!) 97.5 F (36.4 C)     Temp Source 10/31/20 2157 Oral     SpO2 10/31/20 2157 95 %     Weight 10/31/20 2156 82 kg (180 lb 12.4 oz)     Height 10/31/20 2156 1.676 m (5\' 6" )     Head Circumference --      Peak Flow --      Pain Score 10/31/20 2156 7     Pain Loc --      Pain Edu? --      Excl. in GC? --     Constitutional: Alert and oriented.  Eyes: Conjunctivae are normal.  Head: Atraumatic. Cardiovascular: Normal  rate, regular rhythm. Good peripheral circulation. Respiratory: Normal respiratory effort.  No retractions. Rectal: No evidence of external hemorrhoids.  Small anal fissure on the right side of the anus.  No evidence of perirectal or perianal abscess. Musculoskeletal: No lower extremity tenderness nor edema. No gross deformities of extremities. Neurologic:  Normal speech and language. No gross focal neurologic deficits are appreciated.  Skin:  Skin is warm, dry and intact. Psychiatric: Mood and affect are normal. Speech and behavior are normal.  ____________________________________________    INITIAL IMPRESSION / MDM / ASSESSMENT AND PLAN / ED COURSE  As part of my medical decision making, I reviewed the following data within the electronic MEDICAL RECORD NUMBER Nursing notes reviewed and incorporated, Old chart reviewed, Notes from prior ED visits, and [place] Controlled Substance Database   Differential diagnosis includes, but is not limited to, anal fissure, perianal or perirectal abscess, hemorrhoid.  No evidence of hemorrhoid, small area that most likely represents anal fissure but it is difficult for me to visualize.  However it is tender to palpation without any evidence of infection and it is consistent with his clinical history.  I will provide topical nitroglycerin and lidocaine ointment that is helped for him in the past and provide follow-up information for general surgery if you would like to follow-up in clinic.  I gave my usual customary management recommendations and return precautions.           ____________________________________________  FINAL CLINICAL IMPRESSION(S) / ED DIAGNOSES  Final diagnoses:  Anal fissure     MEDICATIONS GIVEN DURING THIS VISIT:  Medications - No data to display   ED Discharge Orders          Ordered    Nitroglycerin 0.4 % OINT  2 times daily        11/01/20 0240    Lidocaine 2 % GEL  4 times daily        11/01/20 0240              Note:  This document was prepared using Dragon voice recognition software and may include unintentional dictation errors.   11/03/20, MD 11/01/20 [phone]

## 2020-11-01 NOTE — ED Notes (Addendum)
Gave PT a cup of ice water.

## 2020-12-09 ENCOUNTER — Other Ambulatory Visit: Payer: Self-pay | Admitting: Family Medicine

## 2020-12-09 DIAGNOSIS — I10 Essential (primary) hypertension: Secondary | ICD-10-CM

## 2020-12-09 NOTE — Telephone Encounter (Signed)
Requested Prescriptions  Pending Prescriptions Disp Refills  . amLODipine (NORVASC) 5 MG tablet [Pharmacy Med Name: amLODIPine Besylate 5 MG Oral Tablet] 90 tablet 1    Sig: Take 1 tablet by mouth once daily     Cardiovascular:  Calcium Channel Blockers Failed - 12/09/2020  4:40 PM      Failed - Last BP in normal range    BP Readings from Last 1 Encounters:  11/01/20 (!) 126/94         Passed - Valid encounter within last 6 months    Recent Outpatient Visits          2 months ago Essential hypertension   [facility] [place], Netta Neat, DO   4 months ago Essential hypertension   [facility] Smitty Cords, DO   10 months ago Essential hypertension   [facility] [place], Netta Neat, DO      Future Appointments            In 1 month Althea Charon, Netta Neat, DO [facility], [facility]

## 2021-01-14 ENCOUNTER — Other Ambulatory Visit: Payer: Self-pay

## 2021-01-14 ENCOUNTER — Encounter: Payer: Self-pay | Admitting: Family Medicine

## 2021-01-14 ENCOUNTER — Ambulatory Visit (INDEPENDENT_AMBULATORY_CARE_PROVIDER_SITE_OTHER): Payer: Self-pay | Admitting: Family Medicine

## 2021-01-14 VITALS — BP 145/92 | HR 98 | Ht 66.0 in | Wt 187.4 lb

## 2021-01-14 DIAGNOSIS — F410 Panic disorder [episodic paroxysmal anxiety] without agoraphobia: Secondary | ICD-10-CM

## 2021-01-14 DIAGNOSIS — F331 Major depressive disorder, recurrent, moderate: Secondary | ICD-10-CM

## 2021-01-14 DIAGNOSIS — R399 Unspecified symptoms and signs involving the genitourinary system: Secondary | ICD-10-CM

## 2021-01-14 DIAGNOSIS — R3915 Urgency of urination: Secondary | ICD-10-CM

## 2021-01-14 DIAGNOSIS — R000 Tachycardia, unspecified: Secondary | ICD-10-CM

## 2021-01-14 DIAGNOSIS — I10 Essential (primary) hypertension: Secondary | ICD-10-CM

## 2021-01-14 DIAGNOSIS — F411 Generalized anxiety disorder: Secondary | ICD-10-CM

## 2021-01-14 MED ORDER — ATENOLOL 25 MG PO TABS: 25.0000 mg | ORAL_TABLET | Freq: Every day | ORAL | 1 refills | Status: DC

## 2021-01-14 NOTE — Progress Notes (Signed)
Subjective:    Patient ID: Gilbert Alvarez, male    DOB: [birth year], 35 y.o.   MRN: [id number]  Gilbert Alvarez is a 35 y.o. male presenting on 01/14/2021 for Hypertension   HPI  Essential Hypertension Tachycardia  Last visit 6.2022 - he had elevated BP >140/90 and elevated HR, he has been maintained on Amlodipine 5mg  with improvement. Last visit he was started on Atenolol 25mg  daily as well for anxiety and tachycardia  Today he has had improved HR improved < 100 consistently anxiety improved and has improved BP but still has some elevated readings >140/90  Admits occasional elevated BP and heart rate with exertion and activity. Some component with anxiety.   Current Meds - Amlodipine 5mg  daily, Atenolol 25mg  daily Lifestyle: - Exercise: limited Denies CP, dyspnea, HA, edema, dizziness / lightheadedness   Anxiety with anxiety attacks ADHD Major Depression recurrent Alcohol Dependence in early remission, previously with RTSA   Followed by [facility] Recently was just switched from Citalopram to Paxil to 40mg . Today is 4th day on this dosage, so new change.   He has some alcohol still, he is working on detox again.   Elevated Hemoglobin. Past history Hgb 15-18 range. Last lab 09/2020  Lower Urinary Tract Symptsom / OAB vs Nocturia Reports symptoms of nocturia few times at night waking up, and admits some weaker stream.  AUA BPH Symptom Score over past 1 month 1. Sensation of not emptying bladder post void - 3 2. Urinate less than 2 hour after finish last void - 4 3. Start/Stop several times during void - 3 4. Difficult to postpone urination - 5 5. Weak urinary stream - 3 6. Push or strain urination - 4 7. Nocturia - 1-3 times  Total Score: 24 (Severe BPH symptoms)    Depression screen [facility] 2/9 01/14/2021 08/04/2020 02/04/2020  Decreased Interest 2 0 3  Down, Depressed, Hopeless 1 0 1  PHQ - 2 Score 3 0 4  Altered sleeping 2 1 3   Tired, decreased energy 2 1 3   Change in  appetite 1 0 0  Feeling bad or failure about yourself  0 0 1  Trouble concentrating 2 1 2   Moving slowly or fidgety/restless 1 1 1   Suicidal thoughts 0 0 0  PHQ-9 Score 11 4 14   Difficult doing work/chores Somewhat difficult Somewhat difficult Somewhat difficult   GAD 7 : Generalized Anxiety Score 01/14/2021 08/04/2020 02/04/2020  Nervous, Anxious, on Edge 2 3 3   Control/stop worrying 2 2 3   Worry too much - different things 2 3 3   Trouble relaxing 2 2 3   Restless 2 2 3   Easily annoyed or irritable 2 1 3   Afraid - awful might happen 2 2 3   Total GAD 7 Score 14 15 21   Anxiety Difficulty Somewhat difficult Very difficult Very difficult     Social History   Tobacco Use   Smoking status: Former    Packs/day: 0.50    Years: 17.00    Pack years: 8.50    Types: Cigarettes    Quit date: 2022    Years since quitting: 0.6   Smokeless tobacco: Never  Vaping Use   Vaping Use: Every day  Substance Use Topics   Alcohol use: Yes    Alcohol/week: 1.0 standard drink    Types: 1 Standard drinks or equivalent per week    Comment: pt reports drinking 3-4 40oz beers daily   Drug use: No    Review of Systems Per HPI  unless specifically indicated above     Objective:    BP (!) 145/92   Pulse 98   Ht 5\' 6"  (1.676 m)   Wt 187 lb 6.4 oz (85 kg)   SpO2 100%   BMI 30.25 kg/m   Wt Readings from Last 3 Encounters:  01/14/21 187 lb 6.4 oz (85 kg)  10/31/20 180 lb 12.4 oz (82 kg)  10/22/20 181 lb (82.1 kg)    Physical Exam Vitals and nursing note reviewed.  Constitutional:      General: He is not in acute distress.    Appearance: He is well-developed. He is not diaphoretic.     Comments: Well-appearing, comfortable, cooperative  HENT:     Head: Normocephalic and atraumatic.  Eyes:     General:        Right eye: No discharge.        Left eye: No discharge.     Conjunctiva/sclera: Conjunctivae normal.  Neck:     Thyroid: No thyromegaly.  Cardiovascular:     Rate and Rhythm:  Normal rate and regular rhythm.     Pulses: Normal pulses.     Heart sounds: Normal heart sounds. No murmur heard. Pulmonary:     Effort: Pulmonary effort is normal. No respiratory distress.     Breath sounds: Normal breath sounds. No wheezing or rales.  Musculoskeletal:        General: Normal range of motion.     Cervical back: Normal range of motion and neck supple.  Lymphadenopathy:     Cervical: No cervical adenopathy.  Skin:    General: Skin is warm and dry.     Findings: No erythema or rash.  Neurological:     Mental Status: He is alert and oriented to person, place, and time. Mental status is at baseline.  Psychiatric:        Behavior: Behavior normal.     Comments: Well groomed, good eye contact, normal speech and thoughts     Results for orders placed or performed during the hospital encounter of 10/22/20  Basic metabolic panel  Result Value Ref Range   Sodium 139 135 - 145 mmol/L   Potassium 3.6 3.5 - 5.1 mmol/L   Chloride 104 98 - 111 mmol/L   CO2 25 22 - 32 mmol/L   Glucose, Bld 109 (H) 70 - 99 mg/dL   BUN 9 6 - 20 mg/dL   Creatinine, Ser 10/24/20 0.61 - 1.24 mg/dL   Calcium 9.2 8.9 - 1.85 mg/dL   GFR, Estimated 63.1 >49 mL/min   Anion gap 10 5 - 15  CBC  Result Value Ref Range   WBC 8.2 4.0 - 10.5 K/uL   RBC 5.71 4.22 - 5.81 MIL/uL   Hemoglobin 18.8 (H) 13.0 - 17.0 g/dL   HCT >70 26.3 - 78.5 %   MCV 91.1 80.0 - 100.0 fL   MCH 32.9 26.0 - 34.0 pg   MCHC 36.2 (H) 30.0 - 36.0 g/dL   RDW 88.5 02.7 - 74.1 %   Platelets 310 150 - 400 K/uL   nRBC 0.0 0.0 - 0.2 %      Assessment & Plan:   Problem List Items Addressed This Visit     Major depressive disorder, recurrent, moderate (HCC)    Followed by Dr 28.7 at [facility] Currently stable on SSRI Paxil 40mg  now      Relevant Medications   traZODone (DESYREL) 50 MG tablet   PARoxetine (PAXIL) 40 MG tablet  Generalized anxiety disorder with panic attacks    Improved Underlying problem associated with  HTN / Tachycardia Continue Atenolol 25mg  and Paxil 40mg  per Psychiatry      Relevant Medications   traZODone (DESYREL) 50 MG tablet   PARoxetine (PAXIL) 40 MG tablet   Essential hypertension - Primary    Stable slightly improved BP home readings but mild elevated here Improved tahycardia on BB now, improving anxiety No known complications    Plan:  1. Continue current therapy Atenolol 25mg  daily and Amlodipine 5mg  daily - we agree to defer change today and will check home BP readings if still elevated in 4 weeks despite improved anxiety we can adjust Amlodipine from 5 to 10 if need also consider alpha blocker option for prostate + HTN if need, will await urology consult  Encourage improved lifestyle - low sodium diet, regular exercise Continue monitor BP outside office, bring readings to next visit, if persistently >140/90 or new symptoms notify office sooner  May consider cardiology for holter monitor in future if unresolved w/ tachycardia palpitations      Relevant Medications   atenolol (TENORMIN) 25 MG tablet   Other Visit Diagnoses     Tachycardia       Relevant Medications   atenolol (TENORMIN) 25 MG tablet   Urinary urgency       Relevant Orders   Ambulatory referral to Urology   Lower urinary tract symptoms (LUTS)       Relevant Orders   Ambulatory referral to Urology       referral to Urology for age 35 year old male with significant LUTS AUA BPH score of 24, with mix of overactive bladder / urgency symptoms and some BPH symptoms. It is interfering with his daily function with frequent bathroom trips and nocturia interfering with sleep. Requesting further evaluation and diagnostic testing / treatment by Urology sent to [facility].  Consider alpha blocker for urinary symptoms and HTN if need.  Virtual call in 4 weeks f/u BP  Meds ordered this encounter  Medications   atenolol (TENORMIN) 25 MG tablet    Sig: Take 1 tablet (25 mg total) by mouth daily.    Dispense:  90  tablet    Refill:  1    Orders Placed This Encounter  Procedures   Ambulatory referral to Urology    Referral Priority:   Routine    Referral Type:   Consultation    Referral Reason:   Specialty Services Required    Requested Specialty:   Urology    Number of Visits Requested:   1     Follow up plan: Return in about 4 weeks (around 02/11/2021) for 4 weeks Telephone visit for BP/HR/Anxiety and Urology f/u.   , DO [facility] 01/14/2021, 9:14 AM

## 2021-01-14 NOTE — Assessment & Plan Note (Signed)
Improved Underlying problem associated with HTN / Tachycardia Continue Atenolol 25mg  and Paxil 40mg  per Psychiatry

## 2021-01-14 NOTE — Patient Instructions (Addendum)
Thank you for coming to the office today.  [facility] -1st floor [address] [place],  [place]  [address] Phone: [phone]  They can discuss the urinary symptoms and concern for possible enlarged prostate vs overactive bladder to help [place] treat it.  Check BP readings after next 2-4 weeks, if still >140/90 consistently let me know - call or message and we can increase one of your BP meds, most likely Amlodipine from 5 to 10mg  at that point.  If Heart rate is still >100 we can adjust Atenolol instead if preferred.   Please schedule a Follow-up Appointment to: Return in about 4 weeks (around 02/11/2021) for 4 weeks Telephone visit for BP/HR/Anxiety and Urology f/u.  If you have any other questions or concerns, please feel free to call the office or send a message through MyChart. You may also schedule an earlier appointment if necessary.  Additionally, you may be receiving a survey about your experience at our office within a few days to 1 week by e-mail or mail. We value your feedback.  04/13/2021, DO [facility], [facility]

## 2021-01-14 NOTE — Assessment & Plan Note (Signed)
Stable slightly improved BP home readings but mild elevated here Improved tahycardia on BB now, improving anxiety No known complications    Plan:  1. Continue current therapy Atenolol 25mg  daily and Amlodipine 5mg  daily - we agree to defer change today and will check home BP readings if still elevated in 4 weeks despite improved anxiety we can adjust Amlodipine from 5 to 10 if need also consider alpha blocker option for prostate + HTN if need, will await urology consult  Encourage improved lifestyle - low sodium diet, regular exercise Continue monitor BP outside office, bring readings to next visit, if persistently >140/90 or new symptoms notify office sooner  May consider cardiology for holter monitor in future if unresolved w/ tachycardia palpitations

## 2021-01-14 NOTE — Assessment & Plan Note (Signed)
Followed by Dr Jonni Sanger at [facility] Currently stable on SSRI Paxil 40mg  now

## 2021-02-08 NOTE — Progress Notes (Deleted)
02/09/2021 7:14 PM   Gilbert Alvarez 08-06-1985 [id number]  Referring provider: Smitty Cords, DO [address] [place],  [place] [address]  No chief complaint on file.   HPI: Gilbert Alvarez is a 35 y.o. male referred for evaluation of lower urinary tract symptoms.  PCP visit 01/14/2021 with complaints of sensation of incomplete emptying, frequency, intermittent urinary stream, urgency, weak stream and urinary hesitancy Nocturia 1-3 IPSS 24/35 Has not been treated for his symptoms Since PCP visit ***   PMH: Past Medical History:  Diagnosis Date   ADHD    Anxiety    Hypertension    Panic attacks    Sleep apnea     Surgical History: No past surgical history on file.  Home Medications:  Allergies as of 02/09/2021       Reactions   Tramadol    Zoloft [sertraline] Other (See Comments)   Altered mental status        Medication List        Accurate as of February 08, 2021  7:14 PM. If you have any questions, ask your nurse or doctor.          amLODipine 5 MG tablet Commonly known as: NORVASC Take 1 tablet by mouth once daily   atenolol 25 MG tablet Commonly known as: TENORMIN Take 1 tablet (25 mg total) by mouth daily.   docusate sodium 100 MG capsule Commonly known as: Colace Take 1 capsule (100 mg total) by mouth 2 (two) times daily.   gabapentin 100 MG capsule Commonly known as: NEURONTIN Take 100 mg by mouth daily.   hydrOXYzine 25 MG capsule Commonly known as: VISTARIL Take 25 mg by mouth 3 (three) times daily.   Lidocaine 2 % Gel Place 1 application around the anus in the morning, at noon, in the evening, and at bedtime.   Nitroglycerin 0.4 % Oint Place 1 application rectally in the morning and at bedtime.   PARoxetine 40 MG tablet Commonly known as: PAXIL Take 40 mg by mouth every morning.   traZODone 50 MG tablet Commonly known as: DESYREL Take 50 mg by mouth at bedtime.        Allergies:  Allergies  Allergen Reactions    Tramadol    Zoloft [Sertraline] Other (See Comments)    Altered mental status    Family History: No family history on file.  Social History:  reports that he quit smoking about 9 months ago. His smoking use included cigarettes. He has a 8.50 pack-year smoking history. He has never used smokeless tobacco. He reports current alcohol use of about 1.0 standard drink per week. He reports that he does not use drugs.   Physical Exam: There were no vitals taken for this visit.  Constitutional:  Alert and oriented, No acute distress. HEENT: [place] AT, moist mucus membranes.  Trachea midline, no masses. Cardiovascular: No clubbing, cyanosis, or edema. Respiratory: Normal respiratory effort, no increased work of breathing. GI: Abdomen is soft, nontender, nondistended, no abdominal masses GU: No CVA tenderness Lymph: No cervical or inguinal lymphadenopathy. Skin: No rashes, bruises or suspicious lesions. Neurologic: Grossly intact, no focal deficits, moving all 4 extremities. Psychiatric: Normal mood and affect.  Laboratory Data: Lab Results  Component Value Date   WBC 8.2 10/22/2020   HGB 18.8 (H) 10/22/2020   HCT 52.0 10/22/2020   MCV 91.1 10/22/2020   PLT 310 10/22/2020    Lab Results  Component Value Date   CREATININE 0.68 10/22/2020  No results found for: PSA  No results found for: TESTOSTERONE  Lab Results  Component Value Date   HGBA1C 4.9 12/08/2019    Urinalysis    Component Value Date/Time   COLORURINE COLORLESS (A) 12/07/2019 2148   APPEARANCEUR CLEAR (A) 12/07/2019 2148   APPEARANCEUR Clear 08/24/2011 1213   LABSPEC 1.001 (L) 12/07/2019 2148   LABSPEC 1.015 08/24/2011 1213   PHURINE 6.0 12/07/2019 2148   GLUCOSEU NEGATIVE 12/07/2019 2148   GLUCOSEU Negative 08/24/2011 1213   HGBUR NEGATIVE 12/07/2019 2148   BILIRUBINUR NEGATIVE 12/07/2019 2148   BILIRUBINUR Negative 08/24/2011 1213   KETONESUR NEGATIVE 12/07/2019 2148   PROTEINUR NEGATIVE 12/07/2019 2148    NITRITE NEGATIVE 12/07/2019 2148   LEUKOCYTESUR NEGATIVE 12/07/2019 2148   LEUKOCYTESUR Negative 08/24/2011 1213    Lab Results  Component Value Date   BACTERIA NONE SEEN 12/07/2019    Pertinent Imaging: *** No results found for this or any previous visit.  No results found for this or any previous visit.  No results found for this or any previous visit.  No results found for this or any previous visit.  No results found for this or any previous visit.  No results found for this or any previous visit.  No results found for this or any previous visit.  No results found for this or any previous visit.   Assessment & Plan:    There are no diagnoses linked to this encounter.  No follow-ups on file.  Riki Altes, MD  [facility] [address] [place], [place] [address] [phone]

## 2021-02-09 ENCOUNTER — Ambulatory Visit: Payer: Self-pay | Admitting: Urology

## 2021-02-11 ENCOUNTER — Encounter: Payer: Self-pay | Admitting: Family Medicine

## 2021-02-11 ENCOUNTER — Ambulatory Visit (INDEPENDENT_AMBULATORY_CARE_PROVIDER_SITE_OTHER): Payer: Self-pay | Admitting: Family Medicine

## 2021-02-11 VITALS — Wt 187.0 lb

## 2021-02-11 DIAGNOSIS — Z91199 Patient's noncompliance with other medical treatment and regimen due to unspecified reason: Secondary | ICD-10-CM

## 2021-02-11 NOTE — Progress Notes (Signed)
Unable to reach patient at scheduled time.

## 2021-02-27 ENCOUNTER — Telehealth: Payer: Self-pay | Admitting: Family Medicine

## 2021-03-18 IMAGING — CR DG CHEST 2V
2 series · 2 of 2 positions shown · non-contrast
Comparison: Radiograph 07/01/2018

CLINICAL DATA: Palpitations

EXAM:
CHEST - 2 VIEW

[chest pa]
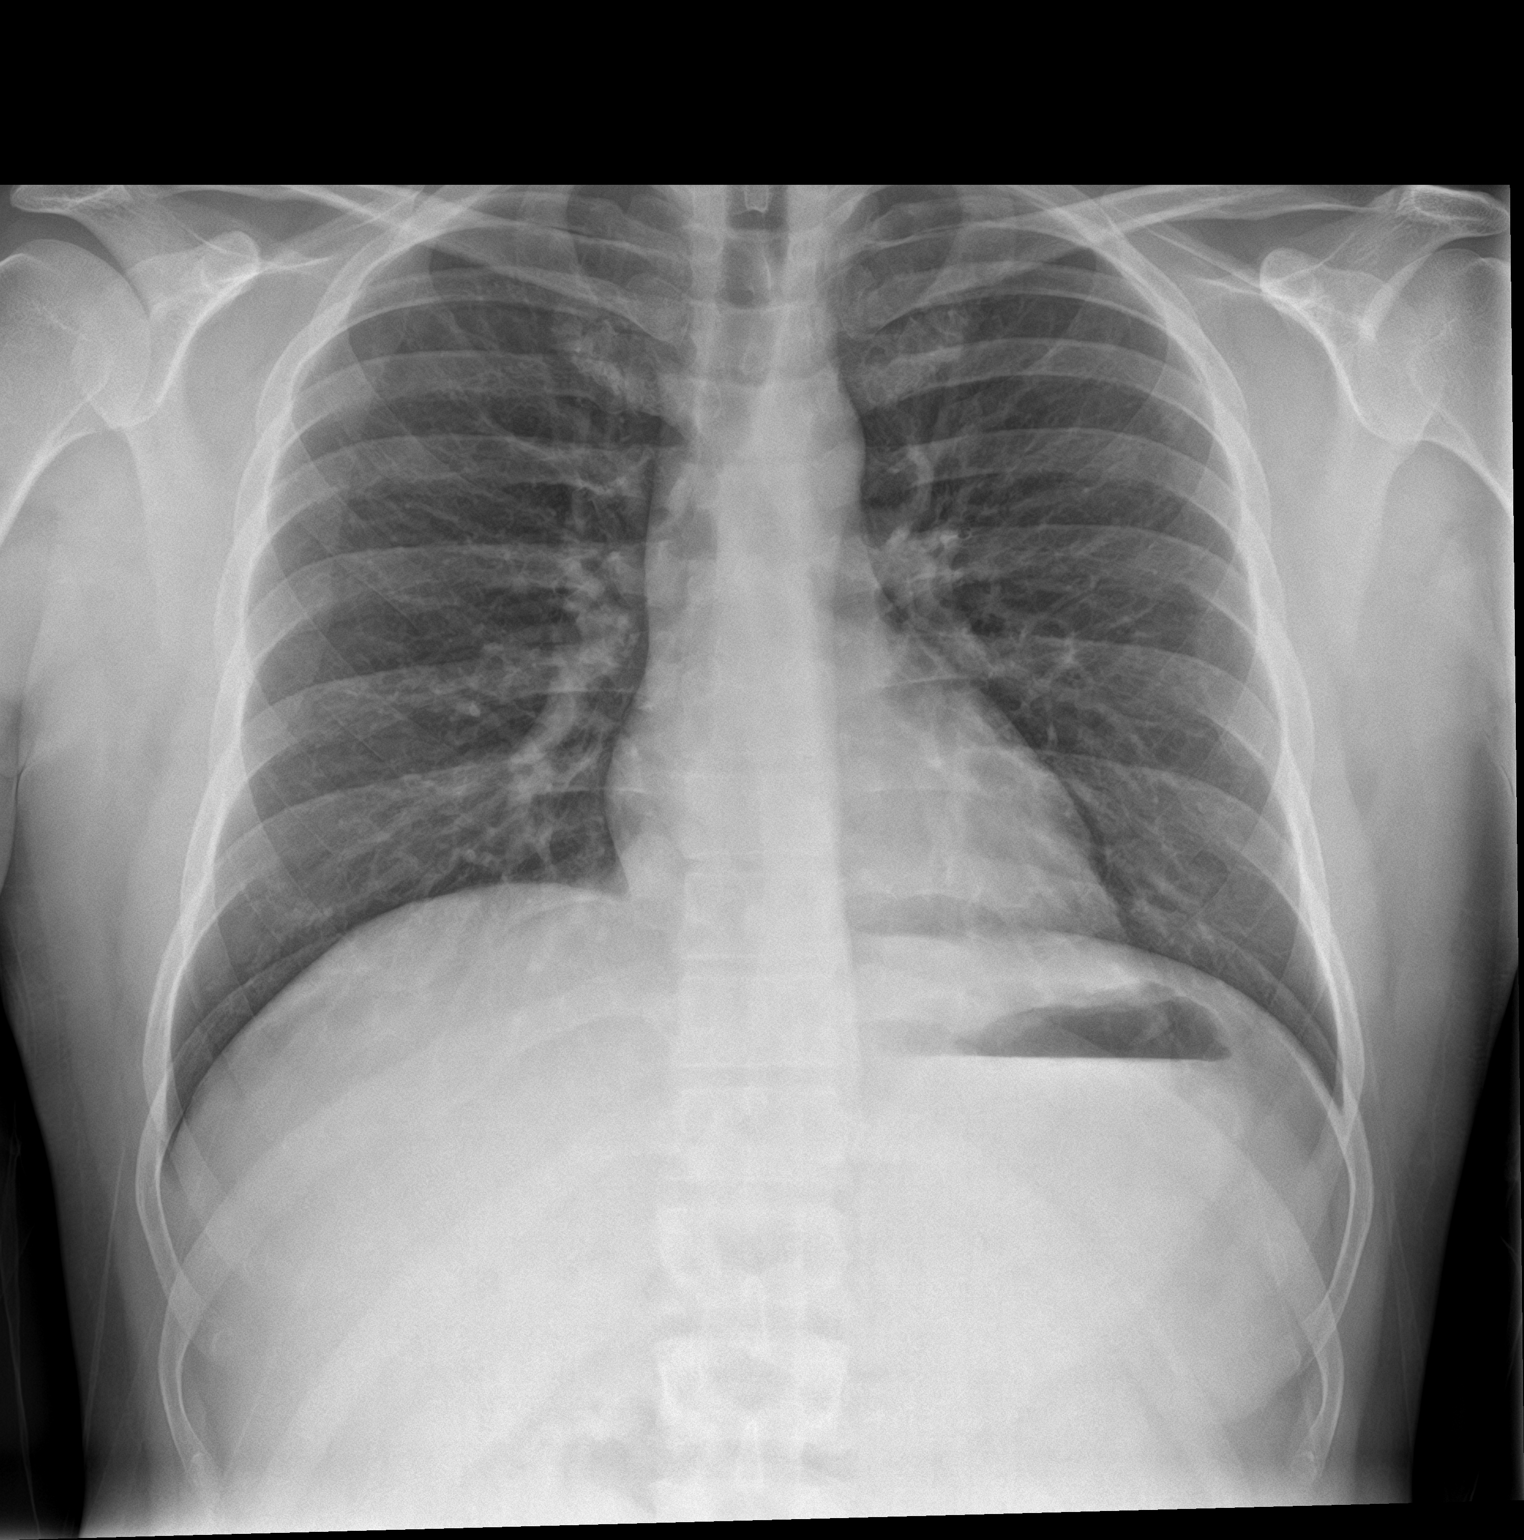

[chest lat]
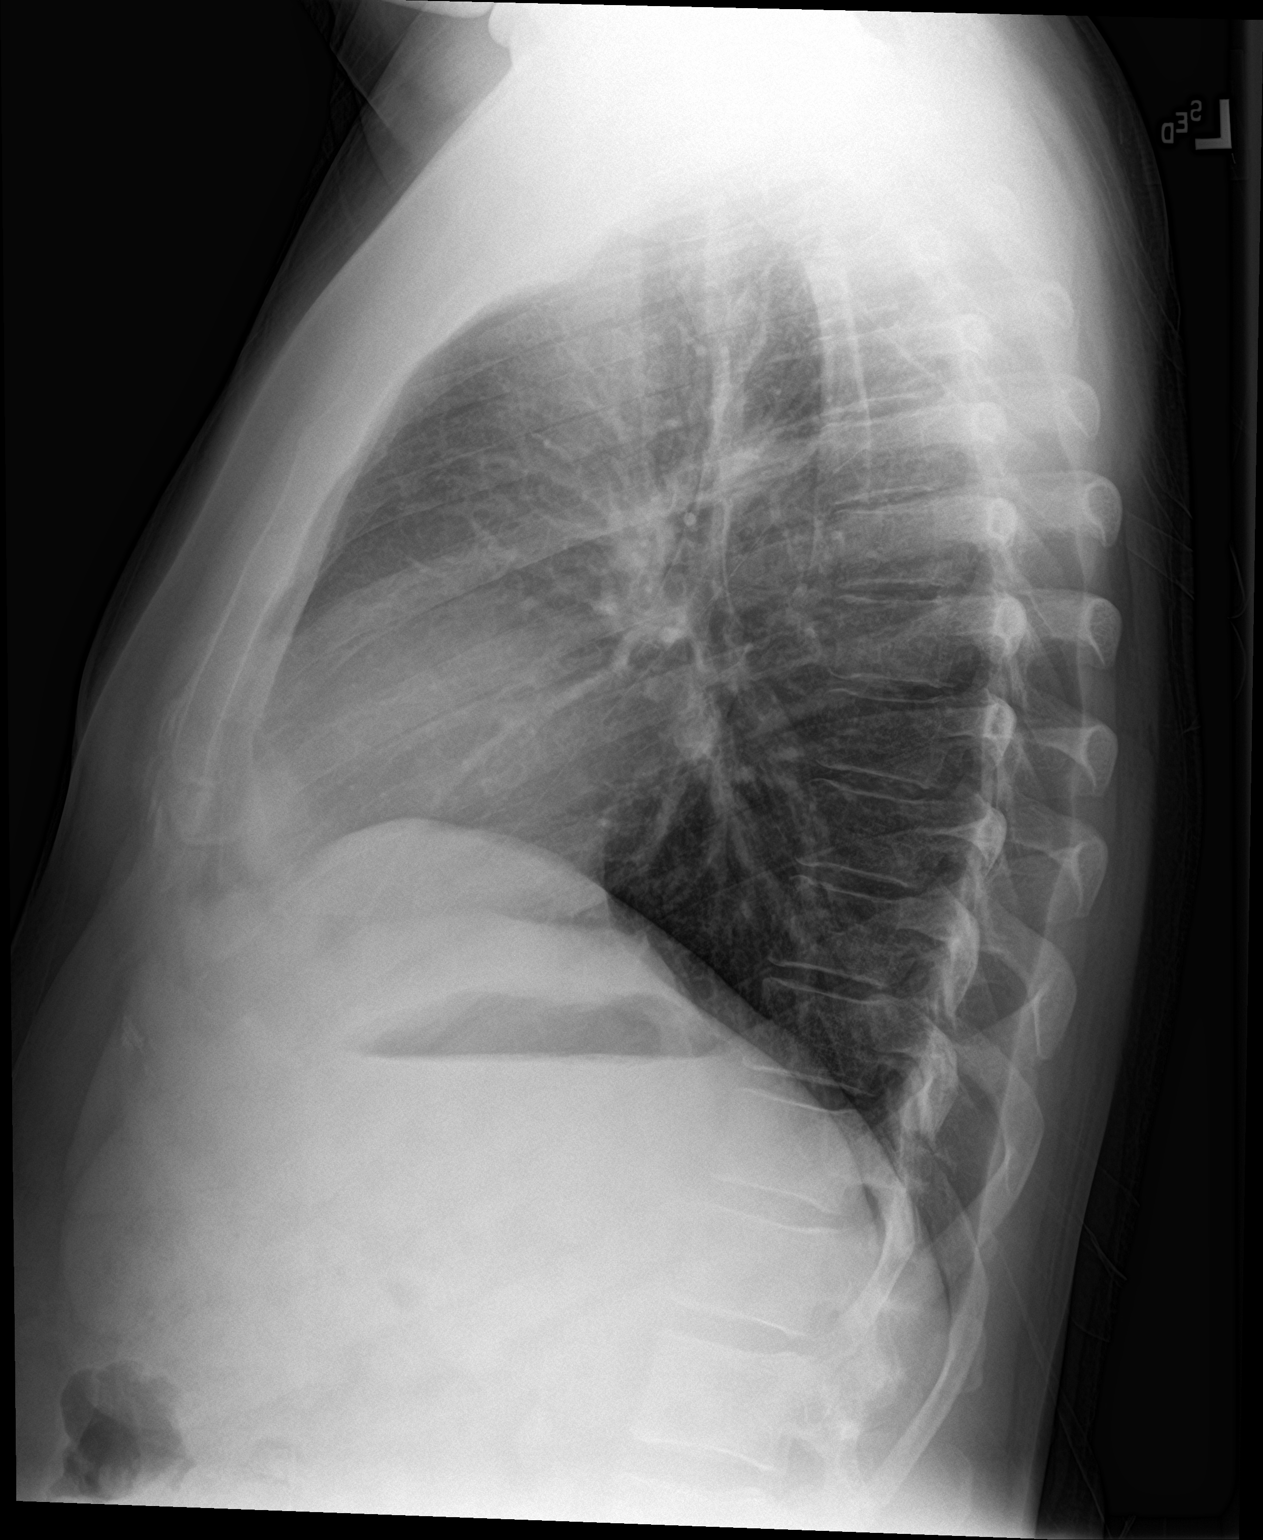

[2 of 2 positions shown; findings below may reference images not displayed]

FINDINGS: No consolidation, features of edema, pneumothorax, or effusion.
Pulmonary vascularity is normally distributed. The cardiomediastinal
contours are unremarkable. No acute osseous or soft tissue
abnormality.
IMPRESSION: No acute cardiopulmonary abnormality.

## 2021-07-13 ENCOUNTER — Other Ambulatory Visit: Payer: Self-pay | Admitting: Family Medicine

## 2021-07-13 DIAGNOSIS — R000 Tachycardia, unspecified: Secondary | ICD-10-CM

## 2021-07-14 NOTE — Telephone Encounter (Signed)
Courtesy refill. Patient needs office visit for further refills. Pt called to set up appt. - left message. ?Requested Prescriptions  ?Pending Prescriptions Disp Refills  ?? atenolol (TENORMIN) 25 MG tablet [Pharmacy Med Name: Atenolol 25 MG Oral Tablet] 30 tablet 0  ?  Sig: Take 1 tablet by mouth once daily  ?  ? Cardiovascular: Beta Blockers 2 Failed - 07/14/2021 12:52 PM  ?  ?  Failed - Last BP in normal range  ?  BP Readings from Last 1 Encounters:  ?01/14/21 (!) 145/92  ?   ?  ?  Failed - Valid encounter within last 6 months  ?  Recent Outpatient Visits   ?      ? 5 months ago No-show for appointment  ? [facility] [place], Netta Neat, DO  ? 6 months ago Essential hypertension  ? [facility], Netta Neat, DO  ? 9 months ago Essential hypertension  ? [facility] [place], Netta Neat, DO  ? 11 months ago Essential hypertension  ? [facility] Smitty Cords, DO  ? 1 year ago Essential hypertension  ? [facility] Smitty Cords, DO  ?  ?  ? ?  ?  ?  Passed - Cr in normal range and within 360 days  ?  Creatinine  ?Date Value Ref Range Status  ?02/25/2012 0.73 0.60 - 1.30 mg/dL Final  ? ?Creatinine, Ser  ?Date Value Ref Range Status  ?10/22/2020 0.68 0.61 - 1.24 mg/dL Final  ?   ?  ?  Passed - Last Heart Rate in normal range  ?  Pulse Readings from Last 1 Encounters:  ?01/14/21 98  ?   ?  ?  ? ?

## 2021-07-16 ENCOUNTER — Ambulatory Visit: Payer: Self-pay | Admitting: Family Medicine

## 2021-07-16 ENCOUNTER — Encounter: Payer: Self-pay | Admitting: Family Medicine

## 2021-07-16 ENCOUNTER — Other Ambulatory Visit: Payer: Self-pay

## 2021-07-16 VITALS — BP 128/88 | HR 91 | Ht 66.0 in | Wt 190.6 lb

## 2021-07-16 DIAGNOSIS — F411 Generalized anxiety disorder: Secondary | ICD-10-CM

## 2021-07-16 DIAGNOSIS — R000 Tachycardia, unspecified: Secondary | ICD-10-CM

## 2021-07-16 DIAGNOSIS — I10 Essential (primary) hypertension: Secondary | ICD-10-CM

## 2021-07-16 DIAGNOSIS — F410 Panic disorder [episodic paroxysmal anxiety] without agoraphobia: Secondary | ICD-10-CM

## 2021-07-16 MED ORDER — AMLODIPINE BESYLATE 5 MG PO TABS: 5.0000 mg | ORAL_TABLET | Freq: Every day | ORAL | 3 refills | Status: DC

## 2021-07-16 MED ORDER — ATENOLOL 25 MG PO TABS: 25.0000 mg | ORAL_TABLET | Freq: Every day | ORAL | 3 refills | Status: DC

## 2021-07-16 NOTE — Assessment & Plan Note (Signed)
Improved BP control on repeat ?Improved tahycardia on BB now, improving anxiety ?No known complications  ?  ?Plan:  ?1. Continue current therapy Atenolol 25mg  daily and Amlodipine 5mg  daily - BP is improved will defer change but reconsider in future dose adjust if need ? ?Encourage improved lifestyle - low sodium diet, regular exercise ?Continue monitor BP outside office, bring readings to next visit, if persistently >140/90 or new symptoms notify office sooner ? ?May consider cardiology for holter monitor in future if unresolved w/ tachycardia palpitations ?

## 2021-07-16 NOTE — Patient Instructions (Addendum)
Thank you for coming to the office today. ? ?Keep a close watch on lower number on BP. If it is consistently >90+ I would recommend trying to take 2 pills of the Amlodipine 5mg  x 2 = 10mg  daily to see what your BP does ? ?Refilled BP meds for 1 year. ? ?Labs in 6 months ? ?DUE for FASTING BLOOD WORK (no food or drink after midnight before the lab appointment, only water or coffee without cream/sugar on the morning of) ? ?SCHEDULE "Lab Only" visit in the morning at the clinic for lab draw in 6 MONTHS  ? ?- Make sure Lab Only appointment is at about 1 week before your next appointment, so that results will be available ? ?For Lab Results, once available within 2-3 days of blood draw, you can can log in to MyChart online to view your results and a brief explanation. Also, we can discuss results at next follow-up visit. ? ? ?Please schedule a Follow-up Appointment to: Return in about 6 months (around 01/16/2022) for 6 month Yearly Visit + fasting lab AFTER AM. ? ?If you have any other questions or concerns, please feel free to call the office or send a message through MyChart. You may also schedule an earlier appointment if necessary. ? ?Additionally, you may be receiving a survey about your experience at our office within a few days to 1 week by e-mail or mail. We value your feedback. ? ? , DO ?[facility], Saralyn Pilar ?

## 2021-07-16 NOTE — Progress Notes (Signed)
? ?Subjective:  ? ? Patient ID: Gilbert Alvarez, male    DOB: 1985/10/03, 36 y.o.   MRN: [id number] ? ?Gilbert Alvarez is a 36 y.o. male presenting on 07/16/2021 for Hypertension and Anxiety ? ? ?HPI ? ?Essential Hypertension ?Tachycardia ? ?Last visit 01/2021 ?Interval updates he checks BP regularly usually 130/90s avg, evening it does decrease into normal range 120-130 / 80-90s, heart rate has been doing better ? ?He has done well without tachycardia, currently controlled on medications. ?Admits occasional elevated BP and heart rate with exertion and activity. Some component with anxiety. ?  ?Current Meds - Amlodipine 5mg  daily, Atenolol 25mg  daily ?Lifestyle: ?- Exercise: limited ?Denies CP, dyspnea, HA, edema, dizziness / lightheadedness ?  ?Anxiety with anxiety attacks ?ADHD ?Major Depression recurrent ?Alcohol Dependence in early remission, previously with RTSA ?  ?Followed by [facility] ?Continues on Paxil (Paroxetine) 40mg  daily ?He admits overall doing well but still has occasional flares at times depending on situational ? ? ? ?Depression screen [facility] 2/9 07/16/2021 01/14/2021 08/04/2020  ?Decreased Interest 2 2 0  ?Down, Depressed, Hopeless 1 1 0  ?PHQ - 2 Score 3 3 0  ?Altered sleeping 2 2 1   ?Tired, decreased energy 2 2 1   ?Change in appetite 1 1 0  ?Feeling bad or failure about yourself  0 0 0  ?Trouble concentrating 2 2 1   ?Moving slowly or fidgety/restless 0 1 1  ?Suicidal thoughts 0 0 0  ?PHQ-9 Score 10 11 4   ?Difficult doing work/chores Somewhat difficult Somewhat difficult Somewhat difficult  ? ? ?Social History  ? ?Tobacco Use  ? Smoking status: Former  ?  Packs/day: 0.50  ?  Years: 17.00  ?  Pack years: 8.50  ?  Types: Cigarettes  ?  Quit date: 2022  ?  Years since quitting: 1.1  ? Smokeless tobacco: Never  ?Vaping Use  ? Vaping Use: Every day  ?Substance Use Topics  ? Alcohol use: Yes  ?  Alcohol/week: 1.0 standard drink  ?  Types: 1 Standard drinks or equivalent per week  ?  Comment: pt reports drinking 3-4  40oz beers daily  ? Drug use: No  ? ? ?Review of Systems ?Per HPI unless specifically indicated above ? ?   ?Objective:  ?  ?BP 128/88 (BP Location: Left Arm, Cuff Size: Normal)   Pulse 91   Ht 5\' 6"  (1.676 m)   Wt 190 lb 9.6 oz (86.5 kg)   SpO2 99%   BMI 30.76 kg/m?   ?Wt Readings from Last 3 Encounters:  ?07/16/21 190 lb 9.6 oz (86.5 kg)  ?02/11/21 187 lb (84.8 kg)  ?01/14/21 187 lb 6.4 oz (85 kg)  ?  ?Physical Exam ? ? ? ?Results for orders placed or performed during the hospital encounter of 10/22/20  ?Basic metabolic panel  ?Result Value Ref Range  ? Sodium 139 135 - 145 mmol/L  ? Potassium 3.6 3.5 - 5.1 mmol/L  ? Chloride 104 98 - 111 mmol/L  ? CO2 25 22 - 32 mmol/L  ? Glucose, Bld 109 (H) 70 - 99 mg/dL  ? BUN 9 6 - 20 mg/dL  ? Creatinine, Ser 0.68 0.61 - 1.24 mg/dL  ? Calcium 9.2 8.9 - 10.3 mg/dL  ? GFR, Estimated >60 >60 mL/min  ? Anion gap 10 5 - 15  ?CBC  ?Result Value Ref Range  ? WBC 8.2 4.0 - 10.5 K/uL  ? RBC 5.71 4.22 - 5.81 MIL/uL  ? Hemoglobin 18.8 (H) 13.0 -  17.0 g/dL  ? HCT 52.0 39.0 - 52.0 %  ? MCV 91.1 80.0 - 100.0 fL  ? MCH 32.9 26.0 - 34.0 pg  ? MCHC 36.2 (H) 30.0 - 36.0 g/dL  ? RDW 13.0 11.5 - 15.5 %  ? Platelets 310 150 - 400 K/uL  ? nRBC 0.0 0.0 - 0.2 %  ? ?   ?Assessment & Plan:  ? ?Problem List Items Addressed This Visit   ? ? 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  Improved ?Underlying problem associated with HTN / Tachycardia ?Continue Atenolol 25mg  and Paxil 40mg  per Psychiatry ?  ?  ? Essential hypertension  ?  Improved BP control on repeat ?Improved tahycardia on BB now, improving anxiety ?No known complications  ?  ?Plan:  ?1. Continue current therapy Atenolol 25mg  daily and Amlodipine 5mg  daily - BP is improved will defer change but reconsider in future dose adjust if need ? ?Encourage improved lifestyle - low sodium diet, regular exercise ?Continue monitor BP outside office, bring readings to next visit, if persistently >140/90 or new symptoms notify  office sooner ? ?May consider cardiology for holter monitor in future if unresolved w/ tachycardia palpitations ?  ?  ? Relevant Medications  ? amLODipine (NORVASC) 5 MG tablet  ? atenolol (TENORMIN) 25 MG tablet  ? ?Other Visit Diagnoses   ? ? Tachycardia      ? Relevant Medications  ? atenolol (TENORMIN) 25 MG tablet  ? ?  ?  ? ? ?Meds ordered this encounter  ?Medications  ? amLODipine (NORVASC) 5 MG tablet  ?  Sig: Take 1 tablet (5 mg total) by mouth daily.  ?  Dispense:  90 tablet  ?  Refill:  3  ? atenolol (TENORMIN) 25 MG tablet  ?  Sig: Take 1 tablet (25 mg total) by mouth daily.  ?  Dispense:  90 tablet  ?  Refill:  3  ? ? ? ?Follow up plan: ?Return in about 6 months (around 01/16/2022) for 6 month Yearly Visit + fasting lab AFTER AM. ? ?Future labs AFTER visit 01/2022 A1c Lipid CMET Lipid ? ? ? , DO ?[facility] ?[facility] ?07/16/2021, 9:33 AM ?

## 2021-07-16 NOTE — Assessment & Plan Note (Signed)
Improved Underlying problem associated with HTN / Tachycardia Continue Atenolol 25mg and Paxil 40mg per Psychiatry 

## 2021-07-28 ENCOUNTER — Ambulatory Visit: Payer: Self-pay

## 2021-07-28 NOTE — Telephone Encounter (Signed)
? ?  Chief Complaint: BP medication ?Symptoms: none ?Frequency: na ?Pertinent Negatives: Patient denies na ?Disposition: [] ED /[] Urgent Care (no appt availability in office) / [] Appointment(In office/virtual)/ []  [place] [facility]/ [] Home Care/ [] Refused Recommended Disposition /[] [place] [facility]/ [x]  Follow-up with PCP ?Additional Notes: Pt called asking which BP medication he could increase if he needed to. Per pt Dr. told him that he could take an extra one of his BP meds if his BP was high. No mention of this in the notes.   ? ?Pt states he takes his BP very often but was not able to clearly give readings when asked. Pt states that readings go from 130 - 150 systolic and about 100 diastolic. I requested that pt begin to make a log of BP's for PCP review. ? ?Please return pt's call regarding medications. ? ? ? ?Summary: medication question  ? Pt has questions about which medication he was told her could increase to assist with lowering his blood pressure. Cb# [phone]   ?  ? ?Reason for Disposition ? [1] Caller has NON-URGENT medicine question about med that PCP prescribed AND [2] triager unable to answer question ? ?Answer Assessment - Initial Assessment Questions ?1. NAME of MEDICATION: "What medicine are you calling about?" ?    Blood pressure medications ?2. QUESTION: "What is your question?" (e.g., double dose of medicine, side effect) ?    Pt is asking which medication Dr. said he could increase if needed ?3. PRESCRIBING HCP: "Who prescribed it?" Reason: if prescribed by specialist, call should be referred to that group. ?    Dr. ?4. SYMPTOMS: "Do you have any symptoms?" ?    no ?5. SEVERITY: If symptoms are present, ask "Are they mild, moderate or severe?" ?    na ?6. PREGNANCY:  "Is there any chance that you are pregnant?" "When was your last menstrual period?" ?    na ? ?Protocols used: Medication Question Call-A-AH ? ?

## 2021-07-28 NOTE — Telephone Encounter (Signed)
Patient stopped by the office and I went over the med recommendation per Dr. Raliegh Ip and asked that he continue to monitor his bp.  ?

## 2021-07-28 NOTE — Telephone Encounter (Signed)
Advice regarding BP management was given to patient per AVS last visit. ? ?He has had high Diastolic BP. ? ?I recommended that if >90+ on bottom number, he may try to double Amlodipine 5mg , therefore 5mg  x 2 pills at one time = 10mg  daily dose. ? ?If this works we can write new order for higher dose 10mg . ? ? , DO ?[facility] ?[facility] ?07/28/2021, 3:14 PM ? ?

## 2021-10-09 IMAGING — CR DG CHEST 2V
1 series · 2 of 2 positions shown · non-contrast
Comparison: 12/30/2019

CLINICAL DATA: Cough and vomiting

EXAM:
CHEST - 2 VIEW

[Series 1: w chest pa · 0.14mm/px · 2 of 2 slices shown]
[im 1/2]
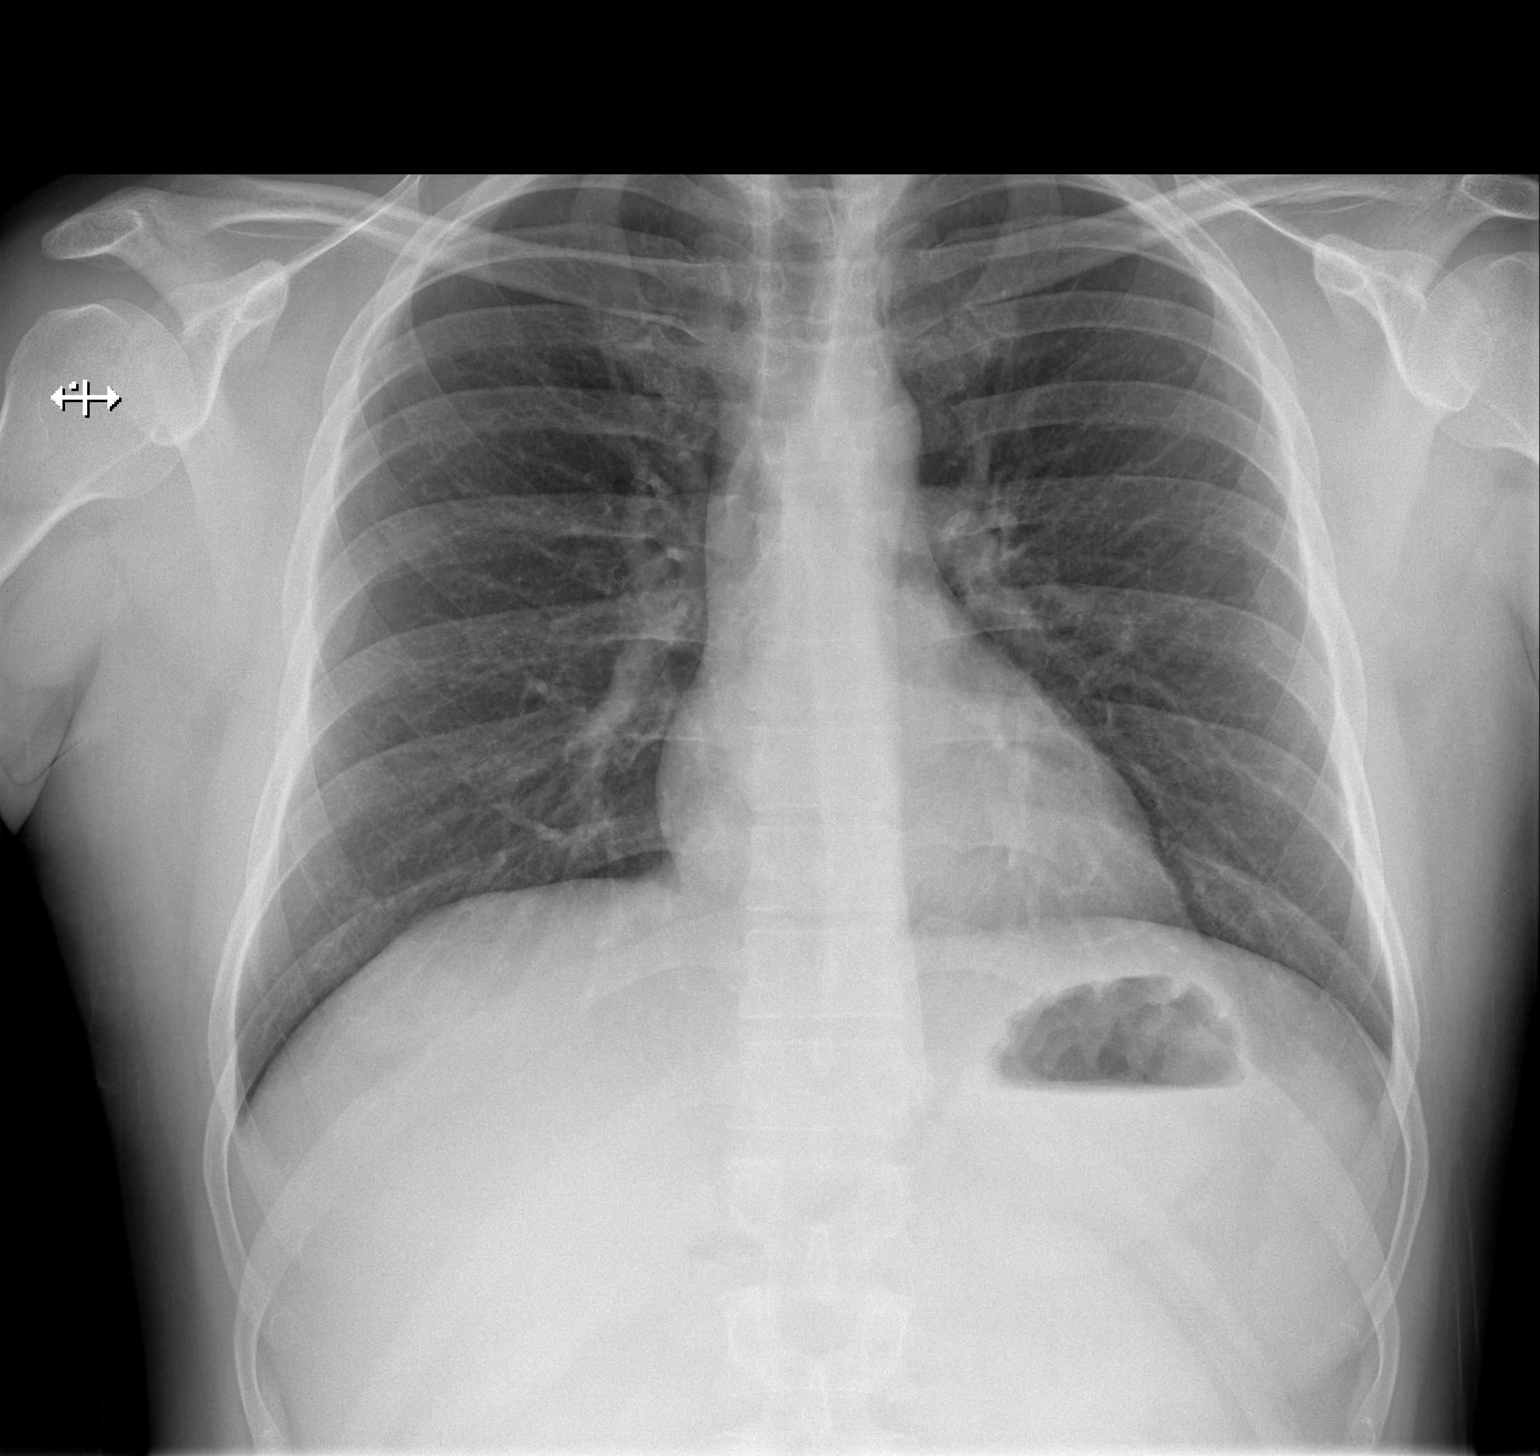
[im 2/2]
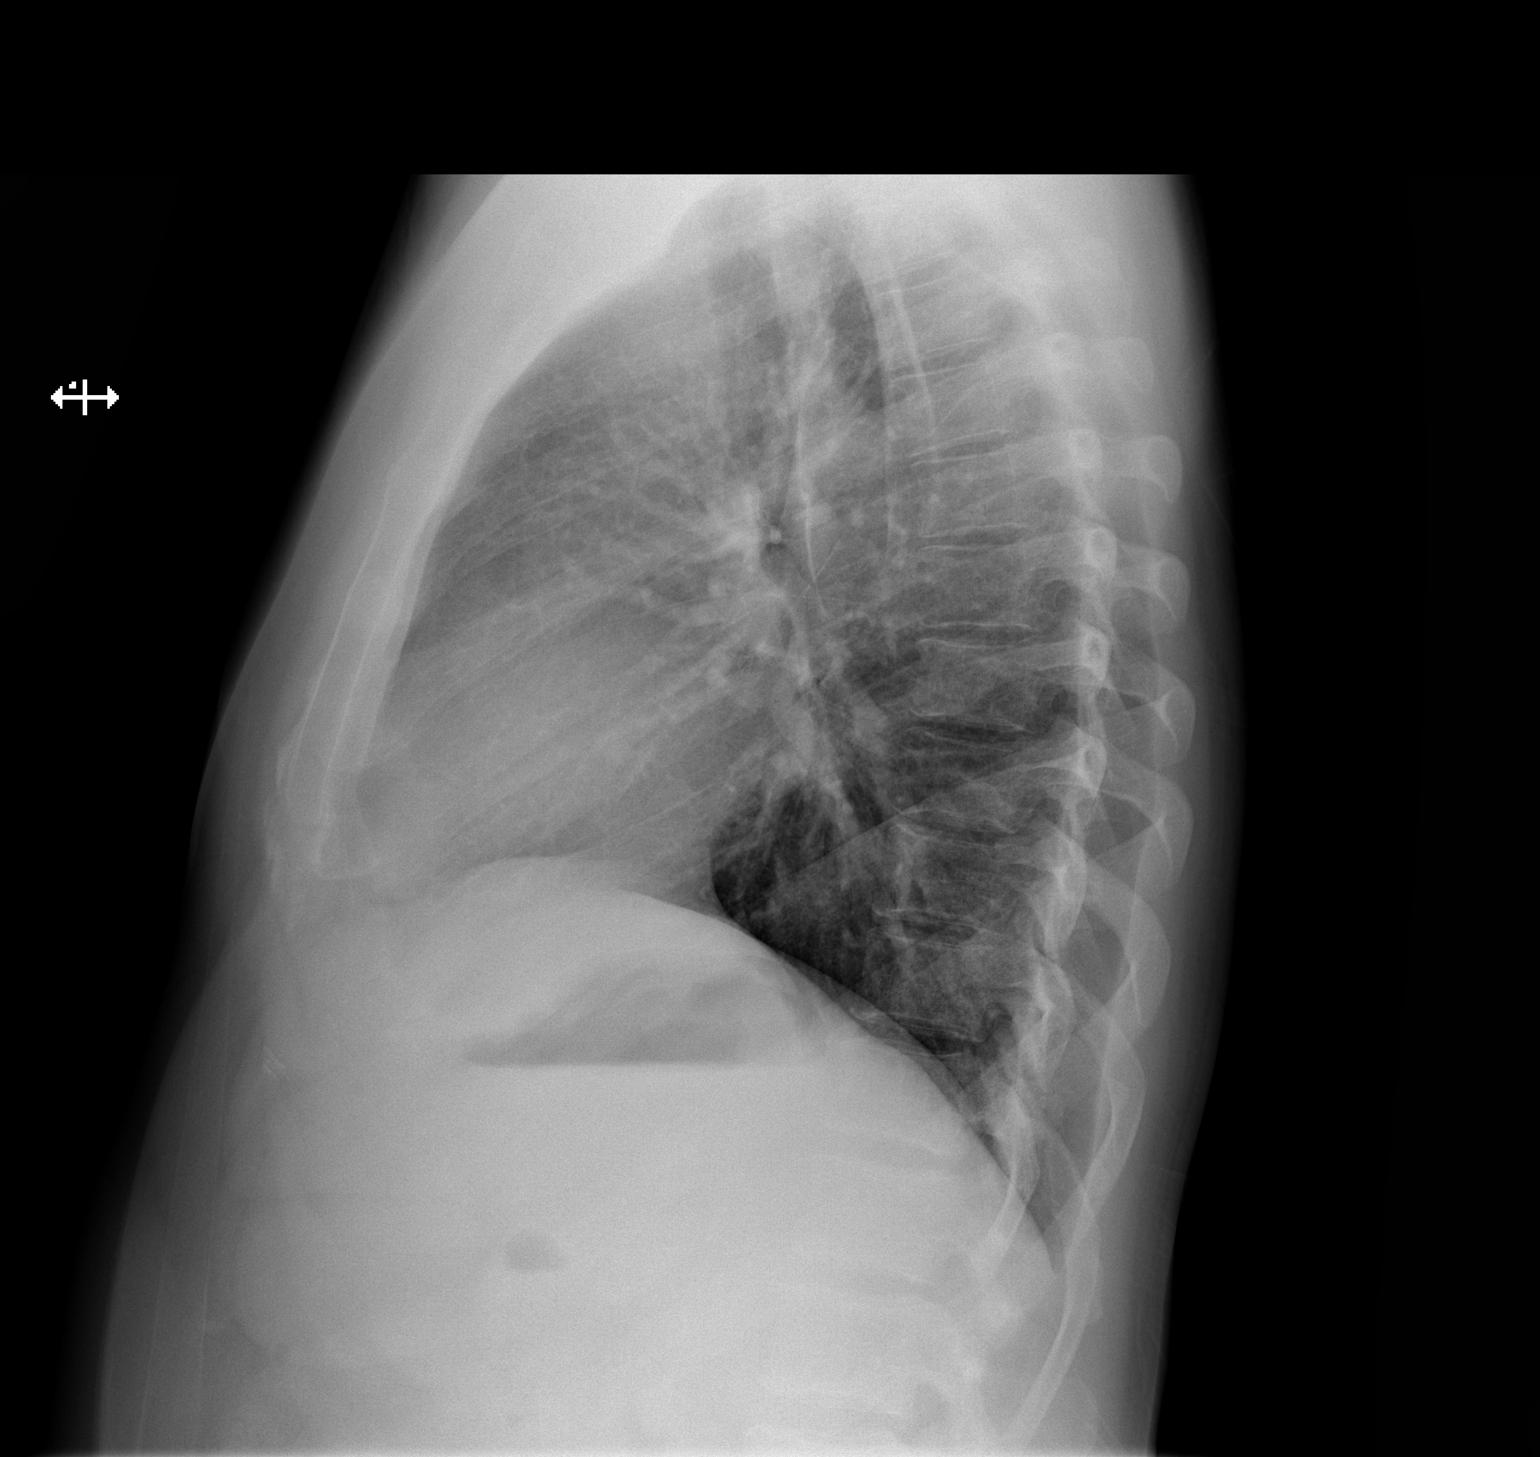

[2 of 2 positions shown; findings below may reference images not displayed]

FINDINGS: The heart size and mediastinal contours are within normal limits.
Both lungs are clear. The visualized skeletal structures are
unremarkable.
IMPRESSION: No active cardiopulmonary disease.

## 2021-11-09 ENCOUNTER — Other Ambulatory Visit: Payer: Self-pay | Admitting: Family Medicine

## 2021-11-09 DIAGNOSIS — I10 Essential (primary) hypertension: Secondary | ICD-10-CM

## 2021-11-09 NOTE — Telephone Encounter (Signed)
Medication Refill - Medication: amLODipine (NORVASC) 10 MG tablet   Has the patient contacted their pharmacy? Yes.     (Agent: If yes, when and what did the pharmacy advise?) Contact PCP office because the MG increase from 5 MG to 10 MG therefore that is why patient is out because he was taking the 5 MG 2 daily. Pharmacy requesting a new script reflecting 10 MG. Patient states he is out and would like request expedited   Preferred Pharmacy (with phone number or street name):   [facility] [address] [place]), [place] - [address] Phone:  [phone]  Fax:  [phone]      Has the patient been seen for an appointment in the last year OR does the patient have an upcoming appointment? Yes.    Agent: Please be advised that RX refills may take up to 3 business days. We ask that you follow-up with your pharmacy.

## 2021-11-11 ENCOUNTER — Other Ambulatory Visit: Payer: Self-pay | Admitting: Family Medicine

## 2021-11-11 DIAGNOSIS — I10 Essential (primary) hypertension: Secondary | ICD-10-CM

## 2021-11-11 MED ORDER — AMLODIPINE BESYLATE 10 MG PO TABS: 10.0000 mg | ORAL_TABLET | Freq: Every day | ORAL | 3 refills | Status: DC

## 2021-11-11 NOTE — Telephone Encounter (Signed)
Requested medication (s) are due for refill today: For review  Per agent: "Pt directed by pharmacy to Contact PCP office because the MG increase from 5 MG to 10 MG therefore that is why patient is out because he was taking the 5 MG 2 daily. Pharmacy requesting a new script reflecting 10 MG. Patient states he is out and would like request expedited"    Preferred Pharmacy (with phone number or street name):    [facility] [address] [place]), [place] - [address] Phone:  [phone]  Fax:  [phone]        Requested Prescriptions  Pending Prescriptions Disp Refills   amLODipine (NORVASC) 5 MG tablet 90 tablet 3    Sig: Take 1 tablet (5 mg total) by mouth daily.     Cardiovascular: Calcium Channel Blockers 2 Passed - 11/09/2021  1:46 PM      Passed - Last BP in normal range    BP Readings from Last 1 Encounters:  07/16/21 128/88         Passed - Last Heart Rate in normal range    Pulse Readings from Last 1 Encounters:  07/16/21 91         Passed - Valid encounter within last 6 months    Recent Outpatient Visits           3 months ago Generalized anxiety disorder with panic attacks   [facility] [place], Netta Neat, DO   9 months ago No-show for appointment   [facility] Smitty Cords, DO   10 months ago Essential hypertension   [facility] [place], Netta Neat, DO   1 year ago Essential hypertension   [facility] Smitty Cords, DO   1 year ago Essential hypertension   [facility] Althea Charon, Netta Neat, DO       Future Appointments             In 2 months Althea Charon, Netta Neat, DO [facility] [place], [facility]               s

## 2022-01-21 ENCOUNTER — Encounter: Payer: Self-pay | Admitting: Family Medicine

## 2022-02-25 ENCOUNTER — Ambulatory Visit: Payer: Self-pay | Admitting: *Deleted

## 2022-02-25 NOTE — Telephone Encounter (Signed)
Chief Complaint: anxiety, elevated BP Symptoms: patient has anxiety that in turn caused his BP to rise. Patient was upset about missing appointment and he started having anxiety attack and his BP started to rise. Patient was able to get BP to go down with relaxation techniques and appointment has been scheduled. Patient will continue to use techniques and has been instructed to call back if ha has changes. Symptoms and perimeters discussed for ED visit Frequency: started today- although patient states he has been seeing a rise in his BP readings Pertinent Negatives: Patient denies suicidal thought, chest pain Disposition: [] ED /[] Urgent Care (no appt availability in office) / [x] Appointment(In office/virtual)/ []  [place] [facility]/ [] Home Care/ [] Refused Recommended Disposition /[] [place] [facility]/ []  Follow-up with PCP Additional Notes:  Patient advised to continue his relaxation techniques- reach out to his therapist if needed and keep appointment tomorrow to discuss BP.   Reason for Disposition  Systolic BP  >= 0000000 OR Diastolic >= [id number]  [id number] Symptoms of anxiety or panic attack AND [2] is a chronic symptom (recurrent or ongoing AND present > 4 weeks)  Answer Assessment - Initial Assessment Questions 1. CONCERN: "Did anything happen that prompted you to call today?"      Comes on without reason 2. ANXIETY SYMPTOMS: "Can you describe how you (your loved one; patient) have been feeling?" (e.g., tense, restless, panicky, anxious, keyed up, overwhelmed, sense of impending doom).      High BP reading, anxious, jittery, hyperventilating  3. ONSET: "How long have you been feeling this way?" (e.g., hours, days, weeks)     Started today 4. SEVERITY: "How would you rate the level of anxiety?" (e.g., 0 - 10; or mild, moderate, severe).     mild 5. FUNCTIONAL IMPAIRMENT: "How have these feelings affected your ability to do daily activities?" "Have you had more difficulty than usual doing your  normal daily activities?" (e.g., getting better, same, worse; self-care, school, work, interactions)     Yes- comes and goes- can last 1 hour  6. HISTORY: "Have you felt this way before?" "Have you ever been diagnosed with an anxiety problem in the past?" (e.g., generalized anxiety disorder, panic attacks, PTSD). If Yes, ask: "How was this problem treated?" (e.g., medicines, counseling, etc.)       7. RISK OF HARM - SUICIDAL IDEATION: "Do you ever have thoughts of hurting or killing yourself?" If Yes, ask:  "Do you have these feelings now?" "Do you have a plan on how you would do this?"     No- no plans 8. TREATMENT:  "What has been done so far to treat this anxiety?" (e.g., medicines, relaxation strategies). "What has helped?"     Medications, relaxation 9. TREATMENT - THERAPIST: "Do you have a counselor or therapist? Name?"     Yes- [facility] 10. POTENTIAL TRIGGERS: "Do you drink caffeinated beverages (e.g., coffee, colas, teas), and how much daily?" "Do you drink alcohol or use any drugs?" "Have you started any new medicines recently?"       *No Answer* 11. PATIENT SUPPORT: "Who is with you now?" "Who do you live with?" "Do you have family or friends who you can talk to?"        *No Answer* 12. OTHER SYMPTOMS: "Do you have any other symptoms?" (e.g., feeling depressed, trouble concentrating, trouble sleeping, trouble breathing, palpitations or fast heartbeat, chest pain, sweating, nausea, or diarrhea)       *No Answer* 13. PREGNANCY: "Is there any chance you are pregnant?" "  When was your last menstrual period?"       *No Answer*  Answer Assessment - Initial Assessment Questions 1. BLOOD PRESSURE: "What is the blood pressure?" "Did you take at least two measurements 5 minutes apart?"     150/97,106, 139/93,112 2. ONSET: "When did you take your blood pressure?"     12:45 3. HOW: "How did you take your blood pressure?" (e.g., automatic home BP monitor, visiting nurse)     Wrist cuff 4. HISTORY:  "Do you have a history of high blood pressure?"     Yes 5. MEDICINES: "Are you taking any medicines for blood pressure?" "Have you missed any doses recently?"     Yes- taking medications 6. OTHER SYMPTOMS: "Do you have any symptoms?" (e.g., blurred vision, chest pain, difficulty breathing, headache, weakness)     Blurred vision, weakness 7. PREGNANCY: "Is there any chance you are pregnant?" "When was your last menstrual period?"  Protocols used: Anxiety and Panic Attack-A-AH, Blood Pressure - High-A-AH

## 2022-02-26 ENCOUNTER — Ambulatory Visit: Payer: Self-pay | Admitting: Family Medicine

## 2022-02-26 ENCOUNTER — Encounter: Payer: Self-pay | Admitting: Family Medicine

## 2022-02-26 VITALS — BP 128/80 | HR 87 | Ht 66.0 in | Wt 187.0 lb

## 2022-02-26 DIAGNOSIS — R000 Tachycardia, unspecified: Secondary | ICD-10-CM

## 2022-02-26 DIAGNOSIS — F411 Generalized anxiety disorder: Secondary | ICD-10-CM

## 2022-02-26 DIAGNOSIS — I10 Essential (primary) hypertension: Secondary | ICD-10-CM

## 2022-02-26 DIAGNOSIS — F410 Panic disorder [episodic paroxysmal anxiety] without agoraphobia: Secondary | ICD-10-CM

## 2022-02-26 MED ORDER — NEBIVOLOL HCL 5 MG PO TABS: 5.0000 mg | ORAL_TABLET | Freq: Every day | ORAL | 5 refills | Status: DC

## 2022-02-26 MED ORDER — NEBIVOLOL HCL 5 MG PO TABS: 5.0000 mg | ORAL_TABLET | Freq: Every day | ORAL | 1 refills | Status: DC

## 2022-02-26 NOTE — Patient Instructions (Addendum)
Thank you for coming to the office today.  Stop Atenolol 25mg  daily - it was not lasting full 24 hours. Also with alcohol BP can fluctuate  Switch med to Nebivolol (Bystolic) 5mg  (24 hour version) - try either 30 day vs 90 day, both are printed  NEXT TIME DUE for FASTING BLOOD WORK (no food or drink after midnight before the lab appointment, only water or coffee without cream/sugar on the morning of)  6 months  - Make sure Lab Only appointment is at about 1 week before your next appointment, so that results will be available  For Lab Results, once available within 2-3 days of blood draw, you can can log in to MyChart online to view your results and a brief explanation. Also, we can discuss results at next follow-up visit.   Please schedule a Follow-up Appointment to: Return in about 6 months (around 08/28/2022) for 6 month follow-up Hypertension, AM apt can do blood after.  If you have any other questions or concerns, please feel free to call the office or send a message through [place]. You may also schedule an earlier appointment if necessary.  Additionally, you may be receiving a survey about your experience at our office within a few days to 1 week by e-mail or mail. We value your feedback.  Nobie Putnam, DO [place]

## 2022-02-26 NOTE — Progress Notes (Signed)
Subjective:    Patient ID: BLESS BELSHE, male    DOB: [birth year], 36 y.o.   MRN: [id number]  Gilbert Alvarez is a 36 y.o. male presenting on 02/26/2022 for Anxiety and Hypertension  Patient presents for a same day appointment.  HPI  Hypertension Tachycardia  Last visit 07/2021, his BP was treated with inc dose Amlodipine from 5 to 10mg , with improvement but then recent had some worsening BP, admits headache.  Home BP readings 150/103, HR 112, later in evening usually improves in PM. He does not take meds usually until 4-5pm approximately usually Today he took meds before apt  Still followed by [facility] No recent med changes.  Continues on Paroxetine 40mg  daily, and Trazodone 50mg  QHS PRN not nightly and Vistaril 25mg  QHS PRN. still has some panic attacks but more maneagable         02/26/2022    4:34 PM 07/16/2021    9:27 AM 01/14/2021    9:03 AM  Depression screen PHQ 2/9  Decreased Interest 3 2 2   Down, Depressed, Hopeless 1 1 1   PHQ - 2 Score 4 3 3   Altered sleeping 1 2 2   Tired, decreased energy 1 2 2   Change in appetite 0 1 1  Feeling bad or failure about yourself  1 0 0  Trouble concentrating 0 2 2  Moving slowly or fidgety/restless 1 0 1  Suicidal thoughts 0 0 0  PHQ-9 Score 8 10 11   Difficult doing work/chores Somewhat difficult Somewhat difficult Somewhat difficult    Social History   Tobacco Use   Smoking status: Former    Packs/day: 0.50    Years: 17.00    Total pack years: 8.50    Types: Cigarettes    Quit date: 2022    Years since quitting: 1.8   Smokeless tobacco: Never  Vaping Use   Vaping Use: Every day  Substance Use Topics   Alcohol use: Yes    Alcohol/week: 1.0 standard drink of alcohol    Types: 1 Standard drinks or equivalent per week    Comment: pt reports drinking 3-4 40oz beers daily   Drug use: No    Review of Systems Per HPI unless specifically indicated above     Objective:    BP 128/80 (BP Location: Left Arm, Cuff Size:  Normal)   Pulse 87   Ht 5\' 6"  (1.829 m)   Wt 187 lb (84.8 kg)   SpO2 98%   BMI 30.18 kg/m   Wt Readings from Last 3 Encounters:  02/26/22 187 lb (84.8 kg)  07/16/21 190 lb 9.6 oz (86.5 kg)  02/11/21 187 lb (84.8 kg)    Physical Exam Vitals and nursing note reviewed.  Constitutional:      General: He is not in acute distress.    Appearance: He is well-developed. He is not diaphoretic.     Comments: Well-appearing, comfortable, cooperative  HENT:     Head: Normocephalic and atraumatic.  Eyes:     General:        Right eye: No discharge.        Left eye: No discharge.     Conjunctiva/sclera: Conjunctivae normal.  Neck:     Thyroid: No thyromegaly.  Cardiovascular:     Rate and Rhythm: Normal rate and regular rhythm.     Pulses: Normal pulses.     Heart sounds: Normal heart sounds. No murmur heard. Pulmonary:     Effort: Pulmonary effort is normal. No respiratory distress.  Breath sounds: Normal breath sounds. No wheezing or rales.  Musculoskeletal:        General: Normal range of motion.     Cervical back: Normal range of motion and neck supple.  Lymphadenopathy:     Cervical: No cervical adenopathy.  Skin:    General: Skin is warm and dry.     Findings: No erythema or rash.  Neurological:     Mental Status: He is alert and oriented to person, place, and time. Mental status is at baseline.  Psychiatric:        Behavior: Behavior normal.     Comments: Well groomed, good eye contact, normal speech and thoughts       Results for orders placed or performed during the hospital encounter of [id number]  Basic metabolic panel  Result Value Ref Range   Sodium 139 135 - 145 mmol/L   Potassium 3.6 3.5 - 5.1 mmol/L   Chloride 104 98 - 111 mmol/L   CO2 25 22 - 32 mmol/L   Glucose, Bld 109 (H) 70 - 99 mg/dL   BUN 9 6 - 20 mg/dL   Creatinine, Ser 0.68 0.61 - 1.24 mg/dL   Calcium 9.2 8.9 - 10.3 mg/dL   GFR, Estimated >60 >60 mL/min   Anion gap 10 5 - 15  CBC  Result  Value Ref Range   WBC 8.2 4.0 - 10.5 K/uL   RBC 5.71 4.22 - 5.81 MIL/uL   Hemoglobin 18.8 (H) 13.0 - 17.0 g/dL   HCT 52.0 39.0 - 52.0 %   MCV 91.1 80.0 - 100.0 fL   MCH 32.9 26.0 - 34.0 pg   MCHC 36.2 (H) 30.0 - 36.0 g/dL   RDW 13.0 11.5 - 15.5 %   Platelets 310 150 - 400 K/uL   nRBC 0.0 0.0 - 0.2 %      Assessment & Plan:   Problem List Items Addressed This Visit     Essential hypertension - Primary   Relevant Medications   nebivolol (BYSTOLIC) 5 MG tablet   Generalized anxiety disorder with panic attacks   Other Visit Diagnoses     Tachycardia           Elevated BP, manual repeat improved Home readings elevated Uncontrolled, question if related to ineffective meds, alcohol, anxiety/stressors. Stop Atenolol 25mg  daily - it was not lasting full 24 hours. Also with alcohol BP can fluctuate BP  Switch med to Nebivolol (Bystolic) 5mg  (24 hour version) - try either 30 day vs 90 day, both are printed and goodrx for best price Alternative offered was Carvedilol BID dosing but he prefers to avoid BID Considered Thiazide vs ARB however defer due to urinary symptoms and hydration concern  Meds ordered this encounter  Medications   DISCONTD: nebivolol (BYSTOLIC) 5 MG tablet    Sig: Take 1 tablet (5 mg total) by mouth daily.    Dispense:  90 tablet    Refill:  1   nebivolol (BYSTOLIC) 5 MG tablet    Sig: Take 1 tablet (5 mg total) by mouth daily.    Dispense:  30 tablet    Refill:  5     Follow up plan: Return in about 6 months (around 08/28/2022) for 6 month follow-up Hypertension, AM apt can do blood after.   Nobie Putnam, [facility] 02/26/2022, 4:32 PM

## 2022-08-23 ENCOUNTER — Other Ambulatory Visit: Payer: Self-pay | Admitting: Family Medicine

## 2022-08-23 DIAGNOSIS — I10 Essential (primary) hypertension: Secondary | ICD-10-CM

## 2022-08-23 NOTE — Telephone Encounter (Signed)
Medication Refill - Medication: nebivolol (BYSTOLIC) 5 MG tablet [[id number]]   Has the patient contacted their pharmacy? Yes.    (Agent: If yes, when and what did the pharmacy advise?) Contact PCP   Preferred Pharmacy (with phone number or street name): [id number] [place] [place] - [address]   Has the patient been seen for an appointment in the last year OR does the patient have an upcoming appointment? Yes.    Agent: Please be advised that RX refills may take up to 3 business days. We ask that you follow-up with your pharmacy.   Pt has 6 pills left and has an upcoming appointment on 08/30/22.

## 2022-08-24 ENCOUNTER — Other Ambulatory Visit: Payer: Self-pay | Admitting: Family Medicine

## 2022-08-24 DIAGNOSIS — I10 Essential (primary) hypertension: Secondary | ICD-10-CM

## 2022-08-24 MED ORDER — NEBIVOLOL HCL 5 MG PO TABS: 5.0000 mg | ORAL_TABLET | Freq: Every day | ORAL | 5 refills | Status: DC

## 2022-08-24 NOTE — Telephone Encounter (Signed)
Requested medication (s) are due for refill today: Yes  Requested medication (s) are on the active medication list: Yes  Last refill:  02/26/22  Future visit scheduled: Yes  Notes to clinic:  Unable to refill per protocol due to failed labs, no updated results.      Requested Prescriptions  Pending Prescriptions Disp Refills   nebivolol (BYSTOLIC) 5 MG tablet 30 tablet 5    Sig: Take 1 tablet (5 mg total) by mouth daily.     Cardiovascular: Beta Blockers 3 Failed - 08/23/2022  3:48 PM      Failed - Cr in normal range and within 360 days    Creatinine  Date Value Ref Range Status  02/25/2012 0.73 0.60 - 1.30 mg/dL Final   Creatinine, Ser  Date Value Ref Range Status  10/22/2020 0.68 0.61 - 1.24 mg/dL Final         Failed - AST in normal range and within 360 days    AST  Date Value Ref Range Status  10/15/2020 34 15 - 41 U/L Final   SGOT(AST)  Date Value Ref Range Status  02/25/2012 33 15 - 37 Unit/L Final         Failed - ALT in normal range and within 360 days    ALT  Date Value Ref Range Status  10/15/2020 34 0 - 44 U/L Final   SGPT (ALT)  Date Value Ref Range Status  02/25/2012 36 12 - 78 U/L Final         Failed - Valid encounter within last 6 months    Recent Outpatient Visits           5 months ago Essential hypertension   [facility] Smitty Cords, DO   1 year ago Generalized anxiety disorder with panic attacks   [place] Smitty Cords, DO   1 year ago No-show for appointment   [place] [facility] Smitty Cords, DO   1 year ago Essential hypertension   [place] [facility] Smitty Cords, DO   1 year ago Essential hypertension   [place] [facility] Althea Charon, Netta Neat, DO       Future Appointments             In 6 days Althea Charon, Netta Neat, DO [facility]            Passed - Last BP in normal range    BP Readings from Last 1 Encounters:  02/26/22 128/80         Passed - Last Heart Rate in normal range    Pulse Readings from Last 1 Encounters:  02/26/22 87

## 2022-08-30 ENCOUNTER — Ambulatory Visit (INDEPENDENT_AMBULATORY_CARE_PROVIDER_SITE_OTHER): Payer: Medicaid Other | Admitting: Family Medicine

## 2022-08-30 ENCOUNTER — Encounter: Payer: Self-pay | Admitting: Family Medicine

## 2022-08-30 VITALS — BP 117/83 | HR 82 | Ht 65.0 in | Wt 189.0 lb

## 2022-08-30 DIAGNOSIS — F411 Generalized anxiety disorder: Secondary | ICD-10-CM | POA: Diagnosis not present

## 2022-08-30 DIAGNOSIS — I10 Essential (primary) hypertension: Secondary | ICD-10-CM | POA: Diagnosis not present

## 2022-08-30 DIAGNOSIS — F900 Attention-deficit hyperactivity disorder, predominantly inattentive type: Secondary | ICD-10-CM | POA: Diagnosis not present

## 2022-08-30 DIAGNOSIS — F410 Panic disorder [episodic paroxysmal anxiety] without agoraphobia: Secondary | ICD-10-CM | POA: Diagnosis not present

## 2022-08-30 MED ORDER — NEBIVOLOL HCL 5 MG PO TABS: 5.0000 mg | ORAL_TABLET | Freq: Every day | ORAL | 3 refills | Status: DC

## 2022-08-30 MED ORDER — AMLODIPINE BESYLATE 10 MG PO TABS: 10.0000 mg | ORAL_TABLET | Freq: Every day | ORAL | 3 refills | Status: DC

## 2022-08-30 NOTE — Progress Notes (Unsigned)
Subjective:    Patient ID: Gilbert Alvarez, male    DOB: 14-Nov-1985, 37 y.o.   MRN: [id number]  Gilbert Alvarez is a 37 y.o. male presenting on 08/30/2022 for Hypertension   HPI  Hypertension Tachycardia  ***Improved on meds Still consumes alcohol, but improving Labs next week   Last visit 07/2021, his BP was treated with inc dose Amlodipine from 5 to , with improvement but then recent had some worsening BP, admits headache.   Home BP readings 150/103, HR 112, later in evening usually improves in PM. He does not take meds usually until 4-5pm approximately usually Today he took meds before apt   Still followed by [facility] Psych[facility] No recent med changes.  Continues on Paroxetine  daily, and Trazodone  QHS PRN not nightly and Vistaril  QHS PRN. still has some panic attacks but more maneagable    Health Maintenance: ***     08/30/2022    2:42 PM 02/26/2022    4:34 PM 07/16/2021    9:27 AM  Depression screen PHQ 2/9  Decreased Interest Down, Depressed, Hopeless PHQ - 2 Score Altered sleeping 0 1 2  Tired, decreased energy Change in appetite 0 0 1  Feeling bad or failure about yourself  1 1 0  Trouble concentrating 1 0 2  Moving slowly or fidgety/restless 0 1 0  Suicidal thoughts 0 0 0  PHQ-9 Score Difficult doing work/chores  Somewhat difficult Somewhat difficult      08/30/2022    2:42 PM 02/26/2022    4:35 PM 07/16/2021    9:27 AM 01/14/2021    9:05 AM  GAD 7 : Generalized Anxiety Score  Nervous, Anxious, on Edge Control/stop worrying Worry too much - different things Trouble relaxing Restless Easily annoyed or irritable Afraid - awful might happen Total GAD 7 Score Anxiety Difficulty  Not difficult at all Somewhat difficult Somewhat difficult     Social History   Tobacco Use   Smoking status: Former    Packs/day: 0.50    Years: 17.00     Additional pack years: 0.00    Total pack years: 8.50    Types: Cigarettes    Quit date: 2022    Years since quitting: 2.3   Smokeless tobacco: Never  Vaping Use   Vaping Use: Every day  Substance Use Topics   Alcohol use: Yes    Alcohol/week: 1.0 standard drink of alcohol    Types: 1 Standard drinks or equivalent per week    Comment: pt reports drinking 3-4 40oz beers daily   Drug use: No    Review of Systems Per HPI unless specifically indicated above     Objective:    BP 117/83   Pulse 82   Ht  (1.651 m)   Wt 189 lb (85.7 kg)   BMI 31.45 kg/m   Wt Readings from Last 3 Encounters:  08/30/22 189 lb (85.7 kg)  02/26/22 187 lb (84.8 kg)  07/16/21 190 lb 9.6 oz (86.5 kg)    Physical Exam   Results for orders placed or performed during the hospital encounter of 10/22/20  Basic metabolic panel  Result Value Ref Range   Sodium 139 135 - 145 mmol/L   Potassium 3.6 3.5 - 5.1 mmol/L   Chloride 104 98 - 111 mmol/L   CO2 25 22 - 32 mmol/L   Glucose, Bld 109 (H) 70 - 99 mg/dL   BUN 9 6 - 20 mg/dL   Creatinine, Ser 1.61 0.61 - 1.24 mg/dL   Calcium 9.2 8.9 - 09.6 mg/dL   GFR, Estimated >04 >54 mL/min   Anion gap 10 5 - 15  CBC  Result Value Ref Range   WBC 8.2 4.0 - 10.5 K/uL   RBC 5.71 4.22 - 5.81 MIL/uL   Hemoglobin 18.8 (H) 13.0 - 17.0 g/dL   HCT 09.8 11.9 - 14.7 %   MCV 91.1 80.0 - 100.0 fL   MCH 32.9 26.0 - 34.0 pg   MCHC 36.2 (H) 30.0 - 36.0 g/dL   RDW 82.9 56.2 - 13.0 %   Platelets 310 150 - 400 K/uL   nRBC 0.0 0.0 - 0.2 %      Assessment & Plan:   Problem List Items Addressed This Visit     Attention deficit hyperactivity disorder (ADHD), predominantly inattentive type   Essential hypertension   Relevant Medications   amLODipine (NORVASC) 10 MG tablet   nebivolol (BYSTOLIC) 5 MG tablet   Generalized anxiety disorder with panic attacks - Primary      Meds ordered this encounter  Medications   amLODipine (NORVASC) 10 MG tablet    Sig: Take  1 tablet (10 mg total) by mouth daily.    Dispense:  90 tablet    Refill:  3   nebivolol (BYSTOLIC) 5 MG tablet    Sig: Take 1 tablet (5 mg total) by mouth daily.    Dispense:  90 tablet    Refill:  3      Follow up plan: Return for 8 days Tues 4/30 10am for fasting lab only, then 6 month Follow-up HTN updates.  Future labs ordered for ***  ***Future labs next week, 4/30 10am  Saralyn Pilar, DO [facility] 08/30/2022, 2:51 PM

## 2022-08-30 NOTE — Patient Instructions (Addendum)
Thank you for coming to the office today.  Re order medications today for blood pressure.  Keep taking current medications. No change. They are working well.  Continue with Mental health [facility] and medications.  DUE for FASTING BLOOD WORK (no food or drink after midnight before the lab appointment, only water or coffee without cream/sugar on the morning of)  SCHEDULE "Lab Only" visit in the morning at the clinic for lab draw in 10am Tues 4/30  - Make sure Lab Only appointment is at about 1 week before your next appointment, so that results will be available  For Lab Results, once available within 2-3 days of blood draw, you can can log in to MyChart online to view your results and a brief explanation. Also, we can discuss results at next follow-up visit.   Please schedule a Follow-up Appointment to: Return for 8 days Tues 4/30 10am for fasting lab only, then 6 month Follow-up HTN updates.  If you have any other questions or concerns, please feel free to call the office or send a message through MyChart. You may also schedule an earlier appointment if necessary.  Additionally, you may be receiving a survey about your experience at our office within a few days to 1 week by e-mail or mail. We value your feedback.  Saralyn Pilar, DO [facility], [place]

## 2022-08-31 ENCOUNTER — Telehealth: Payer: Self-pay | Admitting: Family Medicine

## 2022-08-31 ENCOUNTER — Other Ambulatory Visit: Payer: Self-pay | Admitting: Family Medicine

## 2022-08-31 DIAGNOSIS — F410 Panic disorder [episodic paroxysmal anxiety] without agoraphobia: Secondary | ICD-10-CM

## 2022-08-31 DIAGNOSIS — I10 Essential (primary) hypertension: Secondary | ICD-10-CM

## 2022-08-31 DIAGNOSIS — Z13220 Encounter for screening for lipoid disorders: Secondary | ICD-10-CM

## 2022-08-31 DIAGNOSIS — Z0000 Encounter for general adult medical examination without abnormal findings: Secondary | ICD-10-CM

## 2022-08-31 DIAGNOSIS — R7309 Other abnormal glucose: Secondary | ICD-10-CM

## 2022-08-31 NOTE — Assessment & Plan Note (Addendum)
Followed by [facility] Improved Underlying problem associated with HTN / Tachycardia Continue Nebivolol  and Paxil  per Psychiatry

## 2022-08-31 NOTE — Assessment & Plan Note (Signed)
Controlled HTN Improved tahycardia on BB now, improving anxiety No known complications    Plan:  1. Continue current therapy Nebivolol  daily, Amlodipine  daily  Encourage improved lifestyle - low sodium diet, regular exercise Continue monitor BP outside office, bring readings to next visit, if persistently >140/90 or new symptoms notify office sooner  May consider cardiology for holter monitor in future if unresolved w/ tachycardia palpitations

## 2022-08-31 NOTE — Telephone Encounter (Signed)
Pt's grandmother is calling in because the pharmacy told the pt that they needed a PA for nebivolol (BYSTOLIC) 5 MG tablet [[id number]] before they can fill the prescription. Please advise.

## 2022-09-01 NOTE — Telephone Encounter (Signed)
Spoke with [facility] pharmacy and they are able to get the medication to cost $17.  Advised patient grandmother Gilbert Alvarez that they can contact insurance and see what medications are covered.

## 2022-09-07 ENCOUNTER — Other Ambulatory Visit: Payer: Medicaid Other

## 2022-09-07 DIAGNOSIS — I10 Essential (primary) hypertension: Secondary | ICD-10-CM

## 2022-09-07 DIAGNOSIS — R7309 Other abnormal glucose: Secondary | ICD-10-CM

## 2022-09-07 DIAGNOSIS — Z0000 Encounter for general adult medical examination without abnormal findings: Secondary | ICD-10-CM

## 2022-09-07 DIAGNOSIS — F410 Panic disorder [episodic paroxysmal anxiety] without agoraphobia: Secondary | ICD-10-CM

## 2022-09-07 DIAGNOSIS — Z13220 Encounter for screening for lipoid disorders: Secondary | ICD-10-CM

## 2022-09-07 LAB — CBC WITH DIFFERENTIAL/PLATELET
Eosinophils Relative: 1.4 %
Monocytes Relative: 10.2 %
Neutro Abs: 3814 cells/uL (ref 1500–7800)
Neutrophils Relative %: 45.4 %

## 2022-09-08 LAB — COMPLETE METABOLIC PANEL WITH GFR
AG Ratio: 1.3 (calc) (ref 1.0–2.5)
ALT: 194 U/L — ABNORMAL HIGH (ref 9–46)
AST: 176 U/L — ABNORMAL HIGH (ref 10–40)
Albumin: 4.8 g/dL (ref 3.6–5.1)
Alkaline phosphatase (APISO): 130 U/L (ref 36–130)
BUN: 9 mg/dL (ref 7–25)
CO2: 27 mmol/L (ref 20–32)
Calcium: 9.7 mg/dL (ref 8.6–10.3)
Chloride: 100 mmol/L (ref 98–110)
Creat: 0.78 mg/dL (ref 0.60–1.26)
Globulin: 3.7 g/dL (calc) (ref 1.9–3.7)
Glucose, Bld: 110 mg/dL — ABNORMAL HIGH (ref 65–99)
Potassium: 3.9 mmol/L (ref 3.5–5.3)
Sodium: 138 mmol/L (ref 135–146)
Total Bilirubin: 0.9 mg/dL (ref 0.2–1.2)
Total Protein: 8.5 g/dL — ABNORMAL HIGH (ref 6.1–8.1)
eGFR: 118 mL/min/{1.73_m2} (ref >=60)

## 2022-09-08 LAB — CBC WITH DIFFERENTIAL/PLATELET
Absolute Monocytes: 857 cells/uL (ref 200–950)
Basophils Absolute: 67 cells/uL (ref 0–200)
Basophils Relative: 0.8 %
Eosinophils Absolute: 118 cells/uL (ref 15–500)
HCT: 49.6 % (ref 38.5–50.0)
Hemoglobin: 17.3 g/dL — ABNORMAL HIGH (ref 13.2–17.1)
Lymphs Abs: 3545 cells/uL (ref 850–3900)
MCH: 33.2 pg — ABNORMAL HIGH (ref 27.0–33.0)
MCHC: 34.9 g/dL (ref 32.0–36.0)
MCV: 95.2 fL (ref 80.0–100.0)
MPV: 9.2 fL (ref 7.5–12.5)
Platelets: 267 10*3/uL (ref 140–400)
RBC: 5.21 10*6/uL (ref 4.20–5.80)
RDW: 12.4 % (ref 11.0–15.0)
Total Lymphocyte: 42.2 %
WBC: 8.4 10*3/uL (ref 3.8–10.8)

## 2022-09-08 LAB — HEMOGLOBIN A1C
Hgb A1c MFr Bld: 5.5 % of total Hgb (ref <5.7)
Mean Plasma Glucose: 111 mg/dL
eAG (mmol/L): 6.2 mmol/L

## 2022-09-08 LAB — LIPID PANEL
Cholesterol: 257 mg/dL — ABNORMAL HIGH (ref <200)
HDL: 67 mg/dL (ref >=40)
LDL Cholesterol (Calc): 163 mg/dL (calc) — ABNORMAL HIGH
Non-HDL Cholesterol (Calc): 190 mg/dL (calc) — ABNORMAL HIGH (ref <130)
Total CHOL/HDL Ratio: 3.8 (calc) (ref <5.0)
Triglycerides: 134 mg/dL (ref <150)

## 2022-09-08 LAB — TSH: TSH: 5.4 mIU/L — ABNORMAL HIGH (ref 0.40–4.50)

## 2022-09-15 ENCOUNTER — Other Ambulatory Visit: Payer: Self-pay | Admitting: Family Medicine

## 2022-09-15 DIAGNOSIS — R7989 Other specified abnormal findings of blood chemistry: Secondary | ICD-10-CM

## 2022-09-15 DIAGNOSIS — E7800 Pure hypercholesterolemia, unspecified: Secondary | ICD-10-CM

## 2022-09-16 ENCOUNTER — Telehealth: Payer: Self-pay

## 2022-09-16 NOTE — Telephone Encounter (Signed)
Left message for patient to review lab results and recommendations.

## 2022-09-16 NOTE — Telephone Encounter (Signed)
-----   Message from Smitty Cords, DO sent at 09/15/2022  6:39 PM EDT ----- Please notify patient Lab results released to MyChart with comments for patient.  Remind him about liver ultrasound, and repeat lab in 3 months.  Here are lab results.  Thyroid TSH 5.4, this is mild elevated. Similar to result 2 year ago. We can monitor this going forward. At this point, and at your age I would not suggest treating this with rx thyroid medicine.  Cholesterol shows elevated LDL 163. This is important to treat with lifestyle low cholesterol diet. No medication required at your age for this issue.  Chemistry shows significantly elevated liver enzymes. This can be due to alcohol intake still can impact and harm liver. I am concerned with these numbers.  Next I would suggest an ultrasound test imaging of Liver for further evaluation along with repeat liver enzymes and reducing alcohol further.  I will place the orders. You will be contacted with Ultrasound appt and you can schedule for lab within next 3 months approximately.  Saralyn Pilar, DO [facility] 09/15/2022, 6:38 PM

## 2022-09-22 ENCOUNTER — Ambulatory Visit
Admission: RE | Admit: 2022-09-22 | Discharge: 2022-09-22 | Disposition: A | Discharge status: Home / Self Care | Payer: Medicaid Other | Source: Ambulatory Visit | Attending: Family Medicine | Admitting: Family Medicine

## 2022-09-22 DIAGNOSIS — R7989 Other specified abnormal findings of blood chemistry: Secondary | ICD-10-CM | POA: Insufficient documentation

## 2023-02-10 ENCOUNTER — Other Ambulatory Visit: Payer: MEDICAID

## 2023-02-10 DIAGNOSIS — R7989 Other specified abnormal findings of blood chemistry: Secondary | ICD-10-CM

## 2023-02-10 DIAGNOSIS — E7800 Pure hypercholesterolemia, unspecified: Secondary | ICD-10-CM

## 2023-02-11 LAB — LIPID PANEL
Cholesterol: 254 mg/dL — ABNORMAL HIGH (ref <200)
HDL: 78 mg/dL (ref >=40)
LDL Cholesterol (Calc): 145 mg/dL — ABNORMAL HIGH
Non-HDL Cholesterol (Calc): 176 mg/dL — ABNORMAL HIGH (ref <130)
Total CHOL/HDL Ratio: 3.3 (calc) (ref <5.0)
Triglycerides: 175 mg/dL — ABNORMAL HIGH (ref <150)

## 2023-02-11 LAB — HEPATIC FUNCTION PANEL
AG Ratio: 1.5 (calc) (ref 1.0–2.5)
ALT: 223 U/L — ABNORMAL HIGH (ref 9–46)
AST: 284 U/L — ABNORMAL HIGH (ref 10–40)
Albumin: 5.2 g/dL — ABNORMAL HIGH (ref 3.6–5.1)
Alkaline phosphatase (APISO): 141 U/L — ABNORMAL HIGH (ref 36–130)
Bilirubin, Direct: 0.2 mg/dL (ref 0.0–0.2)
Globulin: 3.5 g/dL (ref 1.9–3.7)
Indirect Bilirubin: 1 mg/dL (ref 0.2–1.2)
Total Bilirubin: 1.2 mg/dL (ref 0.2–1.2)
Total Protein: 8.7 g/dL — ABNORMAL HIGH (ref 6.1–8.1)

## 2023-02-11 LAB — TSH: TSH: 4.58 m[IU]/L — ABNORMAL HIGH (ref 0.40–4.50)

## 2023-02-11 LAB — T4, FREE: Free T4: 1.1 ng/dL (ref 0.8–1.8)

## 2023-03-01 ENCOUNTER — Ambulatory Visit: Payer: MEDICAID | Admitting: Family Medicine

## 2023-03-04 ENCOUNTER — Encounter: Payer: Self-pay | Admitting: Family Medicine

## 2023-03-04 ENCOUNTER — Ambulatory Visit (INDEPENDENT_AMBULATORY_CARE_PROVIDER_SITE_OTHER): Payer: MEDICAID | Admitting: Family Medicine

## 2023-03-04 VITALS — BP 136/88 | HR 100 | Ht 65.0 in | Wt 183.0 lb

## 2023-03-04 DIAGNOSIS — R7989 Other specified abnormal findings of blood chemistry: Secondary | ICD-10-CM

## 2023-03-04 DIAGNOSIS — F1020 Alcohol dependence, uncomplicated: Secondary | ICD-10-CM | POA: Diagnosis not present

## 2023-03-04 DIAGNOSIS — F411 Generalized anxiety disorder: Secondary | ICD-10-CM | POA: Diagnosis not present

## 2023-03-04 DIAGNOSIS — Z1159 Encounter for screening for other viral diseases: Secondary | ICD-10-CM

## 2023-03-04 DIAGNOSIS — K700 Alcoholic fatty liver: Secondary | ICD-10-CM

## 2023-03-04 DIAGNOSIS — F410 Panic disorder [episodic paroxysmal anxiety] without agoraphobia: Secondary | ICD-10-CM | POA: Diagnosis not present

## 2023-03-04 DIAGNOSIS — E7800 Pure hypercholesterolemia, unspecified: Secondary | ICD-10-CM

## 2023-03-04 NOTE — Progress Notes (Signed)
Subjective:    Patient ID: Gilbert Alvarez, male    DOB: Apr 05, 1986, 37 y.o.   MRN: [id number]  Gilbert Alvarez is a 37 y.o. male presenting on 03/04/2023 for No chief complaint on file.   HPI  Discussed the use of AI scribe software for clinical note transcription with the patient, who gave verbal consent to proceed.      Hypertension Tachycardia   On Amlodipine 10mg , Nebivolol 5mg  daily for BP and anxiety  Generalized Anxiety panic Followed by Therapist and Psychiatry On Paroxetine Paxil 20mg  x 3 = 60mg  daily Vistaril 25mg  QHS PRN. Panic improved  Elevated TSH Prior range 5.40, now repeat 4.58 improved on TSH, and Free T4 1.1  Fatty Liver / Alcoholic Liver Damage Elevated LFTs RUQ US showed abnormality of liver texture see results below AST 284 (up from 176) ALT 223 (up from 194)  Hyperlipidemia Improved Lipid patient has made significant efforts to reduce fatty food intake, which has resulted in a notable improvement in his cholesterol levels.  Alcohol dependence The patient acknowledges ongoing alcohol consumption, with an intake of approximately 12-17 standard cans of beer daily. He has previously attempted detoxification treatments but has struggled with maintaining abstinence. The patient expresses a willingness to reduce alcohol consumption but does not currently feel ready to commit to complete abstinence.      03/04/2023    3:31 PM 08/30/2022    2:42 PM 02/26/2022    4:34 PM  Depression screen PHQ 2/9  Decreased Interest 2 3 3   Down, Depressed, Hopeless 1 1 1   PHQ - 2 Score 3 4 4   Altered sleeping 1 0 1  Tired, decreased energy 1 1 1   Change in appetite 0 0 0  Feeling bad or failure about yourself  1 1 1   Trouble concentrating 1 1 0  Moving slowly or fidgety/restless 0 0 1  Suicidal thoughts 0 0 0  PHQ-9 Score 7 7 8   Difficult doing work/chores Somewhat difficult  Somewhat difficult      03/04/2023    3:32 PM 08/30/2022    2:42 PM 02/26/2022    4:35  PM 07/16/2021    9:27 AM  GAD 7 : Generalized Anxiety Score  Nervous, Anxious, on Edge 2 1 1 2   Control/stop worrying 2 1 1 2   Worry too much - different things 2 1 1 2   Trouble relaxing 3 1 1 2   Restless 3 1 1 2   Easily annoyed or irritable 3 2 1 2   Afraid - awful might happen 3 2 1 2   Total GAD 7 Score 18 9 7 14   Anxiety Difficulty   Not difficult at all Somewhat difficult      Social History   Tobacco Use   Smoking status: Former    Current packs/day: 0.00    Average packs/day: 0.5 packs/day for 17.0 years (8.5 ttl pk-yrs)    Types: Cigarettes    Start date: 2005    Quit date: 2022    Years since quitting: 2.8   Smokeless tobacco: Never  Vaping Use   Vaping status: Every Day  Substance Use Topics   Alcohol use: Yes    Alcohol/week: 1.0 standard drink of alcohol    Types: 1 Standard drinks or equivalent per week    Comment: pt reports drinking 3-4 40oz beers daily   Drug use: No    Review of Systems Per HPI unless specifically indicated above     Objective:    BP 136/88 (  BP Location: Left Arm, Patient Position: Sitting, Cuff Size: Normal)   Pulse 100   Ht 5\' 5"  (1.651 m)   Wt 183 lb (83 kg)   SpO2 98%   BMI 30.45 kg/m   Wt Readings from Last 3 Encounters:  03/04/23 183 lb (83 kg)  08/30/22 189 lb (85.7 kg)  02/26/22 187 lb (84.8 kg)    Physical Exam Vitals and nursing note reviewed.  Constitutional:      General: He is not in acute distress.    Appearance: Normal appearance. He is well-developed. He is not diaphoretic.     Comments: Well-appearing, comfortable, cooperative  HENT:     Head: Normocephalic and atraumatic.  Eyes:     General:        Right eye: No discharge.        Left eye: No discharge.     Conjunctiva/sclera: Conjunctivae normal.  Cardiovascular:     Rate and Rhythm: Normal rate.  Pulmonary:     Effort: Pulmonary effort is normal.  Skin:    General: Skin is warm and dry.     Findings: No erythema or rash.  Neurological:      Mental Status: He is alert and oriented to person, place, and time.  Psychiatric:        Mood and Affect: Mood normal.        Behavior: Behavior normal.        Thought Content: Thought content normal.     Comments: Well groomed, good eye contact, normal speech and thoughts      I have personally reviewed the radiology report from Ultrasound Liver RUQ on 09/22/22.  CLINICAL DATA:  Elevated LFTs   EXAM: ULTRASOUND ABDOMEN LIMITED RIGHT UPPER QUADRANT   COMPARISON:  Ultrasound abdomen 09/07/2015   FINDINGS: Gallbladder:   No gallstones or wall thickening visualized. No sonographic Murphy sign noted by sonographer.   Common bile duct:   Diameter: 2.4 mm   Liver:   Increased echogenicity. No focal lesion. Portal vein is patent on color Doppler imaging with normal direction of blood flow towards the liver.   Other: None.   IMPRESSION: 1. Increased hepatic parenchymal echogenicity suggestive of steatosis. 2. No cholelithiasis or sonographic evidence for acute cholecystitis.     Electronically Signed   By: Annia Belt M.D.   On: 09/22/2022 10:31  Results for orders placed or performed in visit on 02/10/23  Lipid panel  Result Value Ref Range   Cholesterol 254 (H) <200 mg/dL   HDL 78 > OR = 40 mg/dL   Triglycerides 295 (H) <150 mg/dL   LDL Cholesterol (Calc) 145 (H) mg/dL (calc)   Total CHOL/HDL Ratio 3.3 <5.0 (calc)   Non-HDL Cholesterol (Calc) 176 (H) <130 mg/dL (calc)  T4, free  Result Value Ref Range   Free T4 1.1 0.8 - 1.8 ng/dL  TSH  Result Value Ref Range   TSH 4.58 (H) 0.40 - 4.50 mIU/L  Hepatic function panel  Result Value Ref Range   Total Protein 8.7 (H) 6.1 - 8.1 g/dL   Albumin 5.2 (H) 3.6 - 5.1 g/dL   Globulin 3.5 1.9 - 3.7 g/dL (calc)   AG Ratio 1.5 1.0 - 2.5 (calc)   Total Bilirubin 1.2 0.2 - 1.2 mg/dL   Bilirubin, Direct 0.2 0.0 - 0.2 mg/dL   Indirect Bilirubin 1.0 0.2 - 1.2 mg/dL (calc)   Alkaline phosphatase (APISO) 141 (H) 36 - 130 U/L    AST 284 (H) 10 - 40 U/L  ALT 223 (H) 9 - 46 U/L      Assessment & Plan:   Problem List Items Addressed This Visit     Generalized anxiety disorder with panic attacks - Primary   Relevant Medications   PARoxetine (PAXIL) 20 MG tablet   Uncomplicated alcohol dependence (HCC)   Other Visit Diagnoses     Alcohol induced fatty liver           Assessment and Plan    Alcohol Use Disorder / Dependence Daily consumption of 12-17 standard cans of alcohol.  RUQ [place] abnormal see results. Elevated liver enzymes indicative of liver stress, likely due to a combination of fatty liver disease and alcohol-induced damage. Patient has previously attempted detoxification. -Encourage reduction in alcohol consumption and improvement in diet to alleviate liver stress. -Discussed potential use of naltrexone oral or Vivitrol injection to reduce alcohol cravings, patient to discuss with therapist. -Continue therapy and discuss substance use  Anxiety Severe anxiety with physical symptoms panic -Continue Paroxetine 60mg  daily. -Encourage continued therapy.  Hypertension Blood pressure controlled at 136/88. -Continue current management.  Hyperlipidemia Improvement in cholesterol levels from 163 to 145 due to dietary changes. -Encourage continuation of dietary improvements.  Follow-up in 6 months to monitor liver enzymes and progress in alcohol reduction.      Orders Placed This Encounter  Procedures   COMPLETE METABOLIC PANEL WITH GFR    Standing Status:   Future    Standing Expiration Date:   03/03/2024   Lipid panel    Standing Status:   Future    Standing Expiration Date:   03/03/2024    Order Specific Question:   Has the patient fasted?    Answer:   Yes   TSH    Standing Status:   Future    Standing Expiration Date:   03/03/2024   T4, free    Standing Status:   Future    Standing Expiration Date:   03/03/2024   Protime-INR    Standing Status:   Future    Standing Expiration  Date:   03/03/2024   Hepatitis C antibody    Standing Status:   Future    Standing Expiration Date:   03/03/2024   Hepatitis B surface antibody,qualitative    Standing Status:   Future    Standing Expiration Date:   03/03/2024   Hepatitis B Surface AntiGEN    Standing Status:   Future    Standing Expiration Date:   03/03/2024   Hepatitis B Core Antibody, total    Standing Status:   Future    Standing Expiration Date:   03/03/2024     No orders of the defined types were placed in this encounter.     Follow up plan: Return in about 6 months (around 09/02/2023) for 6 month fasting lab only then 1 week later Follow-up Liver results..  Future labs ordered for 08/2023   Saralyn Pilar, DO [facility] 03/04/2023, 3:42 PM

## 2023-03-04 NOTE — Patient Instructions (Addendum)
Thank you for coming to the office today.  Ultrasound and labs confirm liver disease  Fatty liver + Alcohol damage to liver  Enzymes elevated. Goal to keep improving diet reduce fatty foods and also reduce alcohol.  -----------------------  Vivitrol Vial / Diluent - Injection series monthly for alcohol dependence to help quit alcohol  Naltrexone pill version of the same medicine can help quit but you need to take pill daily  --------------  Please schedule a Follow-up Appointment to: Return in about 6 months (around 09/02/2023) for 6 month fasting lab only then 1 week later Follow-up Liver results..  If you have any other questions or concerns, please feel free to call the office or send a message through MyChart. You may also schedule an earlier appointment if necessary.  Additionally, you may be receiving a survey about your experience at our office within a few days to 1 week by e-mail or mail. We value your feedback.  Saralyn Pilar, DO [facility], [place]

## 2023-08-26 ENCOUNTER — Other Ambulatory Visit: Payer: Self-pay

## 2023-08-26 DIAGNOSIS — F410 Panic disorder [episodic paroxysmal anxiety] without agoraphobia: Secondary | ICD-10-CM

## 2023-08-26 DIAGNOSIS — E7800 Pure hypercholesterolemia, unspecified: Secondary | ICD-10-CM

## 2023-08-26 DIAGNOSIS — R7989 Other specified abnormal findings of blood chemistry: Secondary | ICD-10-CM

## 2023-08-26 DIAGNOSIS — Z1159 Encounter for screening for other viral diseases: Secondary | ICD-10-CM

## 2023-08-26 DIAGNOSIS — K700 Alcoholic fatty liver: Secondary | ICD-10-CM

## 2023-08-29 ENCOUNTER — Other Ambulatory Visit: Payer: MEDICAID

## 2023-08-30 LAB — COMPLETE METABOLIC PANEL WITHOUT GFR
AG Ratio: 1.3 (calc) (ref 1.0–2.5)
ALT: 110 U/L — ABNORMAL HIGH (ref 9–46)
AST: 139 U/L — ABNORMAL HIGH (ref 10–40)
Albumin: 4.8 g/dL (ref 3.6–5.1)
Alkaline phosphatase (APISO): 121 U/L (ref 36–130)
BUN: 7 mg/dL (ref 7–25)
CO2: 29 mmol/L (ref 20–32)
Calcium: 9.8 mg/dL (ref 8.6–10.3)
Chloride: 98 mmol/L (ref 98–110)
Creat: 0.7 mg/dL (ref 0.60–1.26)
Globulin: 3.7 g/dL (ref 1.9–3.7)
Glucose, Bld: 105 mg/dL — ABNORMAL HIGH (ref 65–99)
Potassium: 4.4 mmol/L (ref 3.5–5.3)
Sodium: 137 mmol/L (ref 135–146)
Total Bilirubin: 1.2 mg/dL (ref 0.2–1.2)
Total Protein: 8.5 g/dL — ABNORMAL HIGH (ref 6.1–8.1)

## 2023-08-30 LAB — LIPID PANEL
Cholesterol: 229 mg/dL — ABNORMAL HIGH (ref <200)
HDL: 66 mg/dL (ref >=40)
LDL Cholesterol (Calc): 130 mg/dL — ABNORMAL HIGH
Non-HDL Cholesterol (Calc): 163 mg/dL — ABNORMAL HIGH (ref <130)
Total CHOL/HDL Ratio: 3.5 (calc) (ref <5.0)
Triglycerides: 189 mg/dL — ABNORMAL HIGH (ref <150)

## 2023-08-30 LAB — HEPATITIS B CORE ANTIBODY, TOTAL: Hep B Core Total Ab: NONREACTIVE

## 2023-08-30 LAB — HEPATITIS B SURFACE ANTIGEN: Hepatitis B Surface Ag: NONREACTIVE

## 2023-08-30 LAB — PROTIME-INR
INR: 1
Prothrombin Time: 10.9 s (ref 9.0–11.5)

## 2023-08-30 LAB — TSH: TSH: 2.98 m[IU]/L (ref 0.40–4.50)

## 2023-08-30 LAB — HEPATITIS C ANTIBODY: Hepatitis C Ab: NONREACTIVE

## 2023-08-30 LAB — T4, FREE: Free T4: 1 ng/dL (ref 0.8–1.8)

## 2023-08-30 LAB — HEPATITIS B SURFACE ANTIBODY,QUALITATIVE: Hep B S Ab: REACTIVE — AB

## 2023-09-01 ENCOUNTER — Encounter: Payer: Self-pay | Admitting: Family Medicine

## 2023-09-02 ENCOUNTER — Ambulatory Visit: Payer: MEDICAID | Admitting: Family Medicine

## 2023-09-02 ENCOUNTER — Encounter: Payer: Self-pay | Admitting: Family Medicine

## 2023-09-02 VITALS — BP 122/88 | HR 95 | Resp 17 | Ht 65.0 in | Wt 179.2 lb

## 2023-09-02 DIAGNOSIS — K700 Alcoholic fatty liver: Secondary | ICD-10-CM

## 2023-09-02 DIAGNOSIS — I10 Essential (primary) hypertension: Secondary | ICD-10-CM

## 2023-09-02 DIAGNOSIS — F410 Panic disorder [episodic paroxysmal anxiety] without agoraphobia: Secondary | ICD-10-CM

## 2023-09-02 DIAGNOSIS — F411 Generalized anxiety disorder: Secondary | ICD-10-CM | POA: Diagnosis not present

## 2023-09-02 DIAGNOSIS — F1020 Alcohol dependence, uncomplicated: Secondary | ICD-10-CM

## 2023-09-02 DIAGNOSIS — R7989 Other specified abnormal findings of blood chemistry: Secondary | ICD-10-CM

## 2023-09-02 MED ORDER — AMLODIPINE BESYLATE 10 MG PO TABS: 10.0000 mg | ORAL_TABLET | Freq: Every day | ORAL | 3 refills | Status: AC

## 2023-09-02 MED ORDER — NEBIVOLOL HCL 5 MG PO TABS: 5.0000 mg | ORAL_TABLET | Freq: Every day | ORAL | 3 refills | Status: AC

## 2023-09-02 NOTE — Progress Notes (Signed)
 Subjective:    Patient ID: Gilbert Alvarez, male    DOB: 09-20-1985, 38 y.o.   MRN: [id number]  Gilbert Alvarez is a 38 y.o. male presenting on 09/02/2023 for Follow-up (Liver lab results)   HPI  Discussed the use of AI scribe software for clinical note transcription with the patient, who gave verbal consent to proceed.  History of Present Illness   Gilbert Alvarez is a 38 year old male with a history of elevated liver enzymes and hyperlipidemia who presents for follow-up and lab review.  He reports better sleep and waking up earlier since making dietary changes.  Hypertension Tachycardia - resolved  On Amlodipine  10mg , Nebivolol  5mg  daily for BP and anxiety   Generalized Anxiety panic Followed by Therapist and Psychiatry On Paroxetine Paxil 20mg  x 3 = 60mg  daily Panic improved He has Trazodone but using it less, now sleeping better.   Elevated TSH Now resolved on recent labs. TSH 4.58 down to 2.98, Free T4 1.0 normal No medication required    Fatty Liver / Alcoholic Liver Damage Elevated LFTs RUQ US  showed abnormality of liver texture see results below Improved LFTs AST 284 > 139  ALT 223 > 110   Hyperlipidemia Improved Lipid Reduced cholesterol on last lab from 160s to 145 to 130 LDL Diet improvements, reduced red meat and beef, now more chicken. Reduced bread now. Increased greens.   Alcohol dependence He has previously attempted detoxification treatments but has struggled with maintaining abstinence. The patient expresses a willingness to reduce alcohol consumption but does still not currently feel ready to commit to complete abstinence. - He acknowledges ongoing alcohol consumption, primarily beer, with intake varying day by day, ranging from 12 to 17 cans. He describes himself as an alcoholic and notes that his alcohol consumption is 'up and down.'      09/02/2023    1:12 PM 03/04/2023    3:31 PM 08/30/2022    2:42 PM  Depression screen PHQ 2/9  Decreased Interest 2 2 3    Down, Depressed, Hopeless 1 1 1   PHQ - 2 Score 3 3 4   Altered sleeping 1 1 0  Tired, decreased energy 0 1 1  Change in appetite 0 0 0  Feeling bad or failure about yourself  1 1 1   Trouble concentrating 0 1 1  Moving slowly or fidgety/restless 1 0 0  Suicidal thoughts 0 0 0  PHQ-9 Score 6 7 7   Difficult doing work/chores Somewhat difficult Somewhat difficult        09/02/2023    1:12 PM 03/04/2023    3:32 PM 08/30/2022    2:42 PM 02/26/2022    4:35 PM  GAD 7 : Generalized Anxiety Score  Nervous, Anxious, on Edge 2 2 1 1   Control/stop worrying 2 2 1 1   Worry too much - different things 2 2 1 1   Trouble relaxing 1 3 1 1   Restless 1 3 1 1   Easily annoyed or irritable 2 3 2 1   Afraid - awful might happen 2 3 2 1   Total GAD 7 Score 12 18 9 7   Anxiety Difficulty Somewhat difficult   Not difficult at all    Social History   Tobacco Use   Smoking status: Former    Current packs/day: 0.00    Average packs/day: 0.5 packs/day for 17.0 years (8.5 ttl pk-yrs)    Types: Cigarettes    Start date: 2005    Quit date: 2022    Years since  quitting: 3.3   Smokeless tobacco: Never  Vaping Use   Vaping status: Every Day  Substance Use Topics   Alcohol use: Yes    Alcohol/week: 1.0 standard drink of alcohol    Types: 1 Standard drinks or equivalent per week    Comment: pt reports drinking 3-4 40oz beers daily   Drug use: No    Review of Systems Per HPI unless specifically indicated above     Objective:    BP 122/88 (BP Location: Left Arm, Patient Position: Sitting, Cuff Size: Normal)   Pulse 95   Resp 17   Ht 5\' 5"  (1.651 m)   Wt 179 lb 3.2 oz (81.3 kg)   SpO2 97%   BMI 29.82 kg/m   Wt Readings from Last 3 Encounters:  09/02/23 179 lb 3.2 oz (81.3 kg)  03/04/23 183 lb (83 kg)  08/30/22 189 lb (85.7 kg)    Physical Exam Vitals and nursing note reviewed.  Constitutional:      General: He is not in acute distress.    Appearance: He is well-developed. He is not  diaphoretic.     Comments: Well-appearing, comfortable, cooperative  HENT:     Head: Normocephalic and atraumatic.  Eyes:     General:        Right eye: No discharge.        Left eye: No discharge.     Conjunctiva/sclera: Conjunctivae normal.  Neck:     Thyroid : No thyromegaly.  Cardiovascular:     Rate and Rhythm: Normal rate and regular rhythm.     Pulses: Normal pulses.     Heart sounds: Normal heart sounds. No murmur heard. Pulmonary:     Effort: Pulmonary effort is normal. No respiratory distress.     Breath sounds: Normal breath sounds. No wheezing or rales.  Musculoskeletal:        General: Normal range of motion.     Cervical back: Normal range of motion and neck supple.  Lymphadenopathy:     Cervical: No cervical adenopathy.  Skin:    General: Skin is warm and dry.     Findings: No erythema or rash.  Neurological:     Mental Status: He is alert and oriented to person, place, and time. Mental status is at baseline.  Psychiatric:        Behavior: Behavior normal.     Comments: Well groomed, good eye contact, normal speech and thoughts     Results for orders placed or performed in visit on 08/26/23  Hepatitis B Core Antibody, total   Collection Time: 08/29/23  9:19 AM  Result Value Ref Range   Hep B Core Total Ab NON-REACTIVE NON-REACTIVE  Hepatitis B Surface AntiGEN   Collection Time: 08/29/23  9:19 AM  Result Value Ref Range   Hepatitis B Surface Ag NON-REACTIVE NON-REACTIVE  Hepatitis B surface antibody,qualitative   Collection Time: 08/29/23  9:19 AM  Result Value Ref Range   Hep B S Ab REACTIVE (A) NON-REACTIVE  Hepatitis C antibody   Collection Time: 08/29/23  9:19 AM  Result Value Ref Range   Hepatitis C Ab NON-REACTIVE NON-REACTIVE  Protime-INR   Collection Time: 08/29/23  9:19 AM  Result Value Ref Range   INR 1.0    Prothrombin Time 10.9 9.0 - 11.5 sec  T4, free   Collection Time: 08/29/23  9:19 AM  Result Value Ref Range   Free T4 1.0 0.8 -  1.8 ng/dL  TSH   Collection Time: 08/29/23  9:19 AM  Result Value Ref Range   TSH 2.98 0.40 - 4.50 mIU/L  Lipid panel   Collection Time: 08/29/23  9:19 AM  Result Value Ref Range   Cholesterol 229 (H) <200 mg/dL   HDL 66 > OR = 40 mg/dL   Triglycerides 098 (H) <150 mg/dL   LDL Cholesterol (Calc) 130 (H) mg/dL (calc)   Total CHOL/HDL Ratio 3.5 <5.0 (calc)   Non-HDL Cholesterol (Calc) 163 (H) <130 mg/dL (calc)  COMPLETE METABOLIC PANEL WITH GFR   Collection Time: 08/29/23  9:19 AM  Result Value Ref Range   Glucose, Bld 105 (H) 65 - 99 mg/dL   BUN 7 7 - 25 mg/dL   Creat 1.19 1.47 - 8.29 mg/dL   BUN/Creatinine Ratio SEE NOTE: 6 - 22 (calc)   Sodium 137 135 - 146 mmol/L   Potassium 4.4 3.5 - 5.3 mmol/L   Chloride 98 98 - 110 mmol/L   CO2 29 20 - 32 mmol/L   Calcium 9.8 8.6 - 10.3 mg/dL   Total Protein 8.5 (H) 6.1 - 8.1 g/dL   Albumin 4.8 3.6 - 5.1 g/dL   Globulin 3.7 1.9 - 3.7 g/dL (calc)   AG Ratio 1.3 1.0 - 2.5 (calc)   Total Bilirubin 1.2 0.2 - 1.2 mg/dL   Alkaline phosphatase (APISO) 121 36 - 130 U/L   AST 139 (H) 10 - 40 U/L   ALT 110 (H) 9 - 46 U/L      Assessment & Plan:   Problem List Items Addressed This Visit     Elevated LFTs   Essential hypertension   Relevant Medications   amLODipine  (NORVASC ) 10 MG tablet   nebivolol  (BYSTOLIC ) 5 MG tablet   Generalized anxiety disorder with panic attacks - Primary   Uncomplicated alcohol dependence (HCC)   Other Visit Diagnoses       Alcohol induced fatty liver             Alcohol use disorder / Dependence Elevated LFTs Fatty Liver Chronic alcohol use with daily intake of 12-17 beers, contributing to elevated liver enzymes. Discussed risks of cirrhosis and permanent liver damage. Liver enzymes improved but remain elevated due to alcohol. Normal Hepatitis panel B and C, he has positive Hep B S Ab from immunity vaccination - Continue efforts to reduce alcohol consumption. - Discuss potential medication options for  alcohol use disorder in future visits.  Discussed risk of permanent liver damage if alcohol continues. - Continue dietary modifications to support liver health. - Consider future liver ultrasound to monitor liver status. Last done in 2024, hepatic steatosis  Hyperlipidemia LDL improved from 163 to 130 with dietary changes. Continued dietary efforts recommended. - Continue dietary modifications, including reduced red meat and increased greens.  Elevated fasting glucose Fasting glucose at 105 mg/dL, slightly above normal. Not concerning but will monitor. - Monitor fasting glucose levels periodically.  Hypertension Controlled Refill Nebivolol  5mg  daily and Amlodipine  10mg  daily  - Continue current dietary modifications, including reduced red meat and increased greens. - Monitor liver function and cholesterol levels periodically. - Consider liver ultrasound in the future to monitor liver health.        No orders of the defined types were placed in this encounter.   Meds ordered this encounter  Medications   amLODipine  (NORVASC ) 10 MG tablet    Sig: Take 1 tablet (10 mg total) by mouth daily.    Dispense:  90 tablet    Refill:  3   nebivolol  (BYSTOLIC )  5 MG tablet    Sig: Take 1 tablet (5 mg total) by mouth daily.    Dispense:  90 tablet    Refill:  3    Follow up plan: Return in about 6 months (around 03/03/2024) for 6 month follow-up HTN, Anxiety, Liver, Updates.   Domingo Friend, DO [facility] 09/02/2023, 1:20 PM

## 2023-09-02 NOTE — Patient Instructions (Addendum)
 Thank you for coming to the office today.  Keep working on reducing alcohol consumption  Great work with the diet, reducing the red meat  Cholesterol And liver are improved  Refilled meds for 1 year.   Lab results show the following   Hep B surface antibody present. This demonstrates immunity to Hep B likely from vaccine Hep B Core antibody and Hep B Surface antigen negative. No sign of prior Hep B infection. Hep C Antibody negative. No sign of prior hep c infection   Thyroid  labs show normalized now. TSH normal and T4 normal. Improved thyroid  function now.   Chemistry shows normal kidney function and electrolytes. Liver enzymes still mild elevated but much improved.   Cholesterol shows LDL 130 improved from previous 140-160 range.     Please schedule a Follow-up Appointment to: Return in about 6 months (around 03/03/2024) for 6 month follow-up HTN, Anxiety, Liver, Updates.  If you have any other questions or concerns, please feel free to call the office or send a message through MyChart. You may also schedule an earlier appointment if necessary.  Additionally, you may be receiving a survey about your experience at our office within a few days to 1 week by e-mail or mail. We value your feedback.  Domingo Friend, DO [facility], [place]

## 2023-12-07 ENCOUNTER — Ambulatory Visit: Payer: Self-pay

## 2023-12-07 NOTE — Telephone Encounter (Signed)
 FYI Only or Action Required?: FYI only for provider.  Patient was last seen in primary care on 09/02/2023 by Edman Marsa PARAS, DO.  Called Nurse Triage reporting Leg Swelling.  Symptoms began several days ago.  Interventions attempted: Nothing.  Symptoms are: bilateral lower leg/ankle/feet pitting edema with bruising above ankles/lower leg (where elastic band of socks hit) gradually worsening.  Triage Disposition: See Physician Within 24 Hours  Patient/caregiver understands and will follow disposition?: Yes               Copied from CRM #[id number]. Topic: Clinical - Red Word Triage >> Dec 07, 2023  3:07 PM Harlene ORN wrote: Red Word that prompted transfer to Nurse Triage: swelling in his legs and feet for the past few days. When he pushes his legs with his finger there is an indent and a bruise. Reason for Disposition  [1] MODERATE leg swelling (e.g., swelling extends up to knees) AND [2] new-onset or getting worse  Answer Assessment - Initial Assessment Questions 1. ONSET: When did the swelling start? (e.g., minutes, hours, days)     X few days, worsening.  2. LOCATION: What part of the leg is swollen?  Are both legs swollen or just one leg?     Bilateral, from feet to just below his calves.  3. SEVERITY: How bad is the swelling? (e.g., localized; mild, moderate, severe)     Pitting edema.  4. REDNESS: Is there redness or signs of infection?     No fever or warmth. He states just notices bruising starting.  5. PAIN: Is the swelling painful to touch? If Yes, ask: How painful is it?   (Scale 1-10; mild, moderate or severe)     No.  6. FEVER: Do you have a fever? If Yes, ask: What is it, how was it measured, and when did it start?      No.  7. CAUSE: What do you think is causing the leg swelling?     No.  8. MEDICAL HISTORY: Do you have a history of blood clots (e.g., DVT), cancer, heart failure, kidney disease, or liver failure?      He states he has fatty liver disease.  9. RECURRENT SYMPTOM: Have you had leg swelling before? If Yes, ask: When was the last time? What happened that time?     No.  10. OTHER SYMPTOMS: Do you have any other symptoms? (e.g., chest pain, difficulty breathing)       Bruising at bottom of calves (elastic band of sock), lump on right leg x couple days, he states he has sleep apnea and he has been having a hard time sleeping (he states he doesn't have the time or money to do the sleep test or buy the machine). Patient denies chest pain, difficulty breathing, recent major surgery, prolonged travel.  11. PREGNANCY: Is there any chance you are pregnant? When was your last menstrual period?       N/a.  Protocols used: Leg Swelling and Edema-A-AH

## 2023-12-08 ENCOUNTER — Ambulatory Visit (INDEPENDENT_AMBULATORY_CARE_PROVIDER_SITE_OTHER): Payer: MEDICAID | Admitting: Family Medicine

## 2023-12-08 ENCOUNTER — Encounter: Payer: Self-pay | Admitting: Family Medicine

## 2023-12-08 VITALS — BP 134/80 | HR 85 | Ht 65.0 in | Wt 185.1 lb

## 2023-12-08 DIAGNOSIS — I872 Venous insufficiency (chronic) (peripheral): Secondary | ICD-10-CM

## 2023-12-08 DIAGNOSIS — F410 Panic disorder [episodic paroxysmal anxiety] without agoraphobia: Secondary | ICD-10-CM

## 2023-12-08 DIAGNOSIS — I10 Essential (primary) hypertension: Secondary | ICD-10-CM

## 2023-12-08 DIAGNOSIS — F411 Generalized anxiety disorder: Secondary | ICD-10-CM | POA: Diagnosis not present

## 2023-12-08 NOTE — Telephone Encounter (Signed)
 Will discuss at upcoming appointment

## 2023-12-08 NOTE — Progress Notes (Signed)
 Subjective:    Patient ID: Gilbert Alvarez, male    DOB: 07-Jun-1985, 38 y.o.   MRN: [id number]  Gilbert Alvarez is a 38 y.o. male presenting on 12/08/2023 for Leg Swelling (Bilateral )  Patient presents for a same day appointment.   HPI  Discussed the use of AI scribe software for clinical note transcription with the patient, who gave verbal consent to proceed.  History of Present Illness   Gilbert Alvarez is a 38 year old male who presents with bilateral lower leg swelling.  Bilateral lower extremity edema - Swelling present in both lower legs, more prominent in the evenings and at night - Edema leaves indentations, especially around the sock line and his ankles. Both sides similar, one not worse than other. No redness or rash. - Swelling is not associated with pain, itching, burning, or aching - Edema is primarily in the lower legs and does not extend below the ankles - Elevation of legs at night improves swelling by the next morning  CHRONIC HTN: Reports elevated today. anxious Current Meds - Amlodipine  10mg  daily, Nebivolol  5mg  daily   Reports good compliance, took meds today. Tolerating well, w/o complaints. Denies CP, dyspnea, HA, edema, dizziness / lightheadedness  Anxiety / Tachycardia - Pulse rate recently elevated to 88-90, typically in the seventies - Increased pulse rate associated with stress from moving           09/02/2023    1:12 PM 03/04/2023    3:31 PM 08/30/2022    2:42 PM  Depression screen PHQ 2/9  Decreased Interest 2 2 3   Down, Depressed, Hopeless 1 1 1   PHQ - 2 Score 3 3 4   Altered sleeping 1 1 0  Tired, decreased energy 0 1 1  Change in appetite 0 0 0  Feeling bad or failure about yourself  1 1 1   Trouble concentrating 0 1 1  Moving slowly or fidgety/restless 1 0 0  Suicidal thoughts 0 0 0  PHQ-9 Score 6 7 7   Difficult doing work/chores Somewhat difficult Somewhat difficult        09/02/2023    1:12 PM 03/04/2023    3:32 PM 08/30/2022    2:42 PM  02/26/2022    4:35 PM  GAD 7 : Generalized Anxiety Score  Nervous, Anxious, on Edge 2 2 1 1   Control/stop worrying 2 2 1 1   Worry too much - different things 2 2 1 1   Trouble relaxing 1 3 1 1   Restless 1 3 1 1   Easily annoyed or irritable 2 3 2 1   Afraid - awful might happen 2 3 2 1   Total GAD 7 Score 12 18 9 7   Anxiety Difficulty Somewhat difficult   Not difficult at all    Social History   Tobacco Use   Smoking status: Former    Current packs/day: 0.00    Average packs/day: 0.5 packs/day for 17.0 years (8.5 ttl pk-yrs)    Types: Cigarettes    Start date: 2005    Quit date: 2022    Years since quitting: 3.5   Smokeless tobacco: Never  Vaping Use   Vaping status: Every Day  Substance Use Topics   Alcohol use: Yes    Alcohol/week: 1.0 standard drink of alcohol    Types: 1 Standard drinks or equivalent per week    Comment: pt reports drinking 3-4 40oz beers daily   Drug use: No    Review of Systems Per HPI unless specifically indicated  above     Objective:    BP 134/80 (BP Location: Left Arm, Cuff Size: Normal)   Pulse 85   Ht 5' 5 (1.651 m)   Wt 185 lb 2 oz (84 kg)   SpO2 96%   BMI 30.81 kg/m   Wt Readings from Last 3 Encounters:  12/08/23 185 lb 2 oz (84 kg)  09/02/23 179 lb 3.2 oz (81.3 kg)  03/04/23 183 lb (83 kg)    Physical Exam Vitals and nursing note reviewed.  Constitutional:      General: He is not in acute distress.    Appearance: Normal appearance. He is well-developed. He is not diaphoretic.     Comments: Well-appearing, comfortable, cooperative  HENT:     Head: Normocephalic and atraumatic.  Eyes:     General:        Right eye: No discharge.        Left eye: No discharge.     Conjunctiva/sclera: Conjunctivae normal.  Cardiovascular:     Rate and Rhythm: Normal rate.  Pulmonary:     Effort: Pulmonary effort is normal.  Musculoskeletal:     Right lower leg: Edema (trace to +1 pitting edema lower leg only, not foot or higher up leg)  present.     Left lower leg: Edema (trace to +1 pitting edema lower leg only, not foot or higher up leg) present.  Skin:    General: Skin is warm and dry.     Findings: No erythema or rash.  Neurological:     Mental Status: He is alert and oriented to person, place, and time.  Psychiatric:        Mood and Affect: Mood normal.        Behavior: Behavior normal.        Thought Content: Thought content normal.     Comments: Well groomed, good eye contact, normal speech and thoughts     Results for orders placed or performed in visit on 08/26/23  Hepatitis B Core Antibody, total   Collection Time: 08/29/23  9:19 AM  Result Value Ref Range   Hep B Core Total Ab NON-REACTIVE NON-REACTIVE  Hepatitis B Surface AntiGEN   Collection Time: 08/29/23  9:19 AM  Result Value Ref Range   Hepatitis B Surface Ag NON-REACTIVE NON-REACTIVE  Hepatitis B surface antibody,qualitative   Collection Time: 08/29/23  9:19 AM  Result Value Ref Range   Hep B S Ab REACTIVE (A) NON-REACTIVE  Hepatitis C antibody   Collection Time: 08/29/23  9:19 AM  Result Value Ref Range   Hepatitis C Ab NON-REACTIVE NON-REACTIVE  Protime-INR   Collection Time: 08/29/23  9:19 AM  Result Value Ref Range   INR 1.0    Prothrombin Time 10.9 9.0 - 11.5 sec  T4, free   Collection Time: 08/29/23  9:19 AM  Result Value Ref Range   Free T4 1.0 0.8 - 1.8 ng/dL  TSH   Collection Time: 08/29/23  9:19 AM  Result Value Ref Range   TSH 2.98 0.40 - 4.50 mIU/L  Lipid panel   Collection Time: 08/29/23  9:19 AM  Result Value Ref Range   Cholesterol 229 (H) <200 mg/dL   HDL 66 > OR = 40 mg/dL   Triglycerides 810 (H) <150 mg/dL   LDL Cholesterol (Calc) 130 (H) mg/dL (calc)   Total CHOL/HDL Ratio 3.5 <5.0 (calc)   Non-HDL Cholesterol (Calc) 163 (H) <130 mg/dL (calc)  COMPLETE METABOLIC PANEL WITH GFR   Collection Time: 08/29/23  9:19 AM  Result Value Ref Range   Glucose, Bld 105 (H) 65 - 99 mg/dL   BUN 7 7 - 25 mg/dL   Creat  9.29 9.39 - 8.73 mg/dL   BUN/Creatinine Ratio SEE NOTE: 6 - 22 (calc)   Sodium 137 135 - 146 mmol/L   Potassium 4.4 3.5 - 5.3 mmol/L   Chloride 98 98 - 110 mmol/L   CO2 29 20 - 32 mmol/L   Calcium 9.8 8.6 - 10.3 mg/dL   Total Protein 8.5 (H) 6.1 - 8.1 g/dL   Albumin 4.8 3.6 - 5.1 g/dL   Globulin 3.7 1.9 - 3.7 g/dL (calc)   AG Ratio 1.3 1.0 - 2.5 (calc)   Total Bilirubin 1.2 0.2 - 1.2 mg/dL   Alkaline phosphatase (APISO) 121 36 - 130 U/L   AST 139 (H) 10 - 40 U/L   ALT 110 (H) 9 - 46 U/L      Assessment & Plan:   Problem List Items Addressed This Visit     Essential hypertension   Generalized anxiety disorder with panic attacks   Relevant Medications   hydrOXYzine  (ATARAX ) 25 MG tablet   Other Visit Diagnoses       Venous insufficiency of both lower extremities    -  Primary        Mild Chronic venous insufficiency with lower extremity edema  highly likely amlodipine  drug side effect. Intermittent lower leg swelling due to venous insufficiency. No pain, itching, or burning. Not cardiac-related. Exacerbated by standing, warm weather  - Advise leg elevation in the evening. / RICE therapy - Encourage increased physical activity. - Recommend adequate hydration. - Suggest mild compression stockings if interested - Provided educational materials on venous insufficiency.  Hypertension Blood pressure initially 140/90 mmHg, improved to 134/80 mmHg. Amlodipine  effective but may contribute to edema. - Continue amlodipine .  Anxiety Increased heart rate due to stress and situational anxiety. Heart rate 88-99 bpm. Anxiety may elevate blood pressure. - Reassured regarding heart rate and blood pressure.        No orders of the defined types were placed in this encounter.   No orders of the defined types were placed in this encounter.   Follow up plan: Return if symptoms worsen or fail to improve.   Marsa Officer, DO [facility] 12/08/2023, 11:13 AM

## 2023-12-08 NOTE — Patient Instructions (Addendum)
 Thank you for coming to the office today.  Use RICE therapy: - R - Rest / relative rest with activity modification avoid overuse of joint - I - Ice packs (make sure you use a towel or sock / something to protect skin) ------------------------------------------------------------------- - C - Compression with socks to apply pressure and reduce swelling allowing more support - E - Elevation - if significant swelling, lift leg above heart level (toes above your nose) to help reduce swelling, most helpful at night after day of being on your feet   Please schedule a Follow-up Appointment to: Return if symptoms worsen or fail to improve.  If you have any other questions or concerns, please feel free to call the office or send a message through MyChart. You may also schedule an earlier appointment if necessary.  Additionally, you may be receiving a survey about your experience at our office within a few days to 1 week by e-mail or mail. We value your feedback.  Marsa Officer, DO [facility], [place]

## 2024-03-02 ENCOUNTER — Ambulatory Visit: Payer: Self-pay

## 2024-03-02 ENCOUNTER — Ambulatory Visit: Payer: MEDICAID | Admitting: Family Medicine

## 2024-03-02 NOTE — Telephone Encounter (Signed)
 FYI Only or Action Required?: Action required by provider: request for appointment and update on patient condition.  Patient was last seen in primary care on 12/08/2023 by Gilbert Marsa PARAS, DO.  Called Nurse Triage reporting Ankle Injury.  Symptoms began a week ago.  Interventions attempted: Rest, hydration, or home remedies.  Symptoms are: unchanged.  Triage Disposition: See Physician Within 24 Hours  Patient/caregiver understands and will follow disposition?: Unsure          Copied from CRM [id number]. Topic: Clinical - Red Word Triage >> Mar 02, 2024 11:23 AM Delon HERO wrote: Red Word that prompted transfer to Nurse Triage: Patient is calling to report that he twisted his left ankle about a week ago - reporting that he is not sleeping well due to the pain. Ankle is swollen and not red. Reason for Disposition  [1] Limp when walking AND [2] due to twisting injury    Pt initially calling to cancel F/U appt with PCP d/t sprained ankle. PAS transferred for triage.   Pt reports that he sprained ankle last week and reports that he's sprained it like this before and alluded that evaluation is necessary. Triager did notify of dispo and advised [facility] over the weekend for evaluation.  Additionally, Triager attempted to reschedule PCP OV, but unable to find provider via Appt Desk Copy and Book It. Forwarding to clinic to further assist. Pt is expecting call back from office for scheduling appt. Pt consents to grandmother, [name] to schedule on his behalf.  Answer Assessment - Initial Assessment Questions 1. MECHANISM: How did the injury happen? (e.g., twisting injury, direct blow)      Twisting ankle  2. ONSET: When did the injury happen? (e.g., minutes or hours ago)      A week ago 3. LOCATION: Where is the injury located?      L  4. APPEARANCE of INJURY: What does the injury look like?      swelling 5. WEIGHT-BEARING: Can you put weight on that foot? Can  you walk (four steps or more)?       Yes, with a limp 6. SIZE: For cuts, bruises, or swelling, ask: How large is it? (e.g., inches or centimeters; entire joint)      denies 7. PAIN: Is there pain? If Yes, ask: How bad is the pain?  What does it keep you from doing? (Scale 0-10; or none, mild, moderate, severe)     3/10 8. TETANUS: For any breaks in the skin, ask: When was your last tetanus booster?     N/a 9. OTHER SYMPTOMS: Do you have any other symptoms?      denies 10. PREGNANCY: Is there any chance you are pregnant? When was your last menstrual period?       N/a  Protocols used: Ankle Injury-A-AH

## 2024-03-19 ENCOUNTER — Encounter: Payer: Self-pay | Admitting: Family Medicine

## 2024-03-19 ENCOUNTER — Ambulatory Visit: Payer: MEDICAID | Admitting: Family Medicine

## 2024-03-19 ENCOUNTER — Other Ambulatory Visit: Payer: Self-pay | Admitting: Family Medicine

## 2024-03-19 VITALS — BP 130/86 | HR 91 | Ht 65.0 in | Wt 192.2 lb

## 2024-03-19 DIAGNOSIS — I10 Essential (primary) hypertension: Secondary | ICD-10-CM

## 2024-03-19 DIAGNOSIS — Z125 Encounter for screening for malignant neoplasm of prostate: Secondary | ICD-10-CM

## 2024-03-19 DIAGNOSIS — F1020 Alcohol dependence, uncomplicated: Secondary | ICD-10-CM | POA: Diagnosis not present

## 2024-03-19 DIAGNOSIS — F411 Generalized anxiety disorder: Secondary | ICD-10-CM

## 2024-03-19 DIAGNOSIS — F410 Panic disorder [episodic paroxysmal anxiety] without agoraphobia: Secondary | ICD-10-CM

## 2024-03-19 DIAGNOSIS — R7309 Other abnormal glucose: Secondary | ICD-10-CM

## 2024-03-19 DIAGNOSIS — Z0000 Encounter for general adult medical examination without abnormal findings: Secondary | ICD-10-CM

## 2024-03-19 DIAGNOSIS — Z1159 Encounter for screening for other viral diseases: Secondary | ICD-10-CM

## 2024-03-19 DIAGNOSIS — Z114 Encounter for screening for human immunodeficiency virus [HIV]: Secondary | ICD-10-CM

## 2024-03-19 DIAGNOSIS — Z23 Encounter for immunization: Secondary | ICD-10-CM

## 2024-03-19 DIAGNOSIS — I872 Venous insufficiency (chronic) (peripheral): Secondary | ICD-10-CM

## 2024-03-19 DIAGNOSIS — E7800 Pure hypercholesterolemia, unspecified: Secondary | ICD-10-CM

## 2024-03-19 DIAGNOSIS — R7989 Other specified abnormal findings of blood chemistry: Secondary | ICD-10-CM
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
## 2024-03-19 NOTE — Patient Instructions (Addendum)
 Thank you for coming to the office today.  TDap tetanus vaccine today good for 10 years.  All meds have refills  DUE for FASTING BLOOD WORK (no food or drink after midnight before the lab appointment, only water or coffee without cream/sugar on the morning of)  SCHEDULE Lab Only visit in the morning at the clinic for lab draw in 6 MONTHS   - Make sure Lab Only appointment is at about 1 week before your next appointment, so that results will be available  For Lab Results, once available within 2-3 days of blood draw, you can can log in to MyChart online to view your results and a brief explanation. Also, we can discuss results at next follow-up visit.   Please schedule a Follow-up Appointment to: Return in about 6 months (around 09/16/2024) for 6 month fasting lab > 1 week later Annual Physical.  If you have any other questions or concerns, please feel free to call the office or send a message through MyChart. You may also schedule an earlier appointment if necessary.  Additionally, you may be receiving a survey about your experience at our office within a few days to 1 week by e-mail or mail. We value your feedback.  Marsa Officer, DO [facility], [place]

## 2024-09-10 ENCOUNTER — Other Ambulatory Visit: Payer: MEDICAID

## 2024-09-17 ENCOUNTER — Encounter: Payer: MEDICAID | Admitting: Family Medicine
